# Patient Record
Sex: Male | Born: 1970 | Race: Black or African American | Hispanic: No | Marital: Single | Smoking: Never smoker
Health system: Southern US, Community
[De-identification: ages and names within clinical notes are randomized; demographics above are authoritative.]

## PROBLEM LIST (undated history)

## (undated) DIAGNOSIS — F419 Anxiety disorder, unspecified: Secondary | ICD-10-CM

## (undated) DIAGNOSIS — I10 Essential (primary) hypertension: Secondary | ICD-10-CM

## (undated) DIAGNOSIS — N200 Calculus of kidney: Secondary | ICD-10-CM

## (undated) DIAGNOSIS — M1990 Unspecified osteoarthritis, unspecified site: Secondary | ICD-10-CM

## (undated) DIAGNOSIS — Z87442 Personal history of urinary calculi: Secondary | ICD-10-CM

---

## 2012-09-28 ENCOUNTER — Encounter (HOSPITAL_COMMUNITY): Payer: Self-pay | Admitting: Emergency Medicine

## 2012-09-28 ENCOUNTER — Emergency Department (HOSPITAL_COMMUNITY)
Admission: EM | Admit: 2012-09-28 | Discharge: 2012-09-28 | Disposition: A | Discharge status: Home / Self Care | Payer: Self-pay | Attending: Emergency Medicine | Admitting: Emergency Medicine

## 2012-09-28 ENCOUNTER — Emergency Department (HOSPITAL_COMMUNITY): Payer: Self-pay

## 2012-09-28 DIAGNOSIS — N200 Calculus of kidney: Secondary | ICD-10-CM | POA: Insufficient documentation

## 2012-09-28 DIAGNOSIS — R1031 Right lower quadrant pain: Secondary | ICD-10-CM | POA: Insufficient documentation

## 2012-09-28 LAB — CBC WITH DIFFERENTIAL/PLATELET
Basophils Relative: 0 % (ref 0–1)
Eosinophils Absolute: 0.1 10*3/uL (ref 0.0–0.7)
Hemoglobin: 16.6 g/dL (ref 13.0–17.0)
MCH: 30.7 pg (ref 26.0–34.0)
MCHC: 35.8 g/dL (ref 30.0–36.0)
Monocytes Relative: 11 % (ref 3–12)
Neutro Abs: 3.7 10*3/uL (ref 1.7–7.7)
Neutrophils Relative %: 49 % (ref 43–77)
Platelets: 185 10*3/uL (ref 150–400)
RBC: 5.4 MIL/uL (ref 4.22–5.81)

## 2012-09-28 LAB — URINE MICROSCOPIC-ADD ON

## 2012-09-28 LAB — POCT I-STAT, CHEM 8
Chloride: 107 mEq/L (ref 96–112)
Glucose, Bld: 112 mg/dL — ABNORMAL HIGH (ref 70–99)
HCT: 50 % (ref 39.0–52.0)
Hemoglobin: 17 g/dL (ref 13.0–17.0)
Potassium: 3.5 mEq/L (ref 3.5–5.1)
Sodium: 145 mEq/L (ref 135–145)

## 2012-09-28 LAB — URINALYSIS, ROUTINE W REFLEX MICROSCOPIC
Glucose, UA: NEGATIVE mg/dL
Leukocytes, UA: NEGATIVE
Nitrite: NEGATIVE
Protein, ur: 30 mg/dL — AB
pH: 5.5 (ref 5.0–8.0)

## 2012-09-28 LAB — CK: Total CK: 332 U/L — ABNORMAL HIGH (ref 7–232)

## 2012-09-28 MED ORDER — NAPROXEN 500 MG PO TABS: 500.0000 mg | ORAL_TABLET | Freq: Two times a day (BID) | ORAL | Status: DC

## 2012-09-28 MED ORDER — IOHEXOL 300 MG/ML  SOLN: 100.0000 mL | Freq: Once | INTRAMUSCULAR | Status: DC | PRN

## 2012-09-28 MED ORDER — HYDROMORPHONE HCL PF 1 MG/ML IJ SOLN
1.0000 mg | Freq: Once | INTRAMUSCULAR | Status: AC
  Administered 2012-09-28: 1 mg via INTRAVENOUS
  Filled 2012-09-28: qty 1

## 2012-09-28 MED ORDER — PROMETHAZINE HCL 25 MG PO TABS: 25.0000 mg | ORAL_TABLET | Freq: Four times a day (QID) | ORAL | Status: DC | PRN

## 2012-09-28 MED ORDER — IOHEXOL 300 MG/ML  SOLN
50.0000 mL | Freq: Once | INTRAMUSCULAR | Status: AC | PRN
  Administered 2012-09-28: 50 mL via ORAL

## 2012-09-28 MED ORDER — IOHEXOL 300 MG/ML  SOLN
100.0000 mL | Freq: Once | INTRAMUSCULAR | Status: AC | PRN
  Administered 2012-09-28: 100 mL via INTRAVENOUS

## 2012-09-28 MED ORDER — HYDROCODONE-ACETAMINOPHEN 5-500 MG PO TABS: 1.0000 | ORAL_TABLET | Freq: Four times a day (QID) | ORAL | Status: DC | PRN

## 2012-09-28 MED ORDER — ONDANSETRON HCL 4 MG/2ML IJ SOLN
4.0000 mg | Freq: Once | INTRAMUSCULAR | Status: AC
  Administered 2012-09-28: 4 mg via INTRAVENOUS
  Filled 2012-09-28: qty 2

## 2012-09-28 MED ORDER — TAMSULOSIN HCL 0.4 MG PO CAPS: 0.4000 mg | ORAL_CAPSULE | Freq: Two times a day (BID) | ORAL | Status: DC

## 2012-09-28 NOTE — ED Notes (Signed)
Attempted to collect urine, Pt unable to void at this time.

## 2012-09-28 NOTE — ED Provider Notes (Signed)
History     CSN: [id number]  Arrival date & time 09/28/12  4098   First MD Initiated Contact with Patient 09/28/12 [phone]      Chief Complaint  Patient presents with  . Hematuria    (Consider location/radiation/quality/duration/timing/severity/associated sxs/prior treatment) HPI Comments: Pt with 1 week of hematuria - this has been constant, assocaited with RLQ pain and flank pain which is constant, nothing makes it better or worse.  No fevers or vomiting.  Was seen in UC last week and had cipro rx given for hematuria but has gotten worse - feels as though the urine has become more red from pink last week.  Nothing makes better or worse including cipro.  Denies injury, d/c from penis or hx of STD's, no testicular pain or swelling.  Patient is a 42 y.o. male presenting with hematuria. The history is provided by the patient and the spouse.  Hematuria    History reviewed. No pertinent past medical history.  History reviewed. No pertinent past surgical history.  No family history on file.  History  Substance Use Topics  . Smoking status: Never Smoker   . Smokeless tobacco: Not on file  . Alcohol Use: No      Review of Systems  Genitourinary: Positive for hematuria.  All other systems reviewed and are negative.    Allergies  Review of patient's allergies indicates not on file.  Home Medications  No current outpatient prescriptions on file.  BP 153/107  Pulse 92  Temp(Src) 98.7 F (37.1 C) (Oral)  Resp 22  SpO2 97%  Physical Exam  Nursing note and vitals reviewed. Constitutional: He appears well-developed and well-nourished.  Uncomfortable appearing  HENT:  Head: Normocephalic and atraumatic.  Mouth/Throat: Oropharynx is clear and moist. No oropharyngeal exudate.  Eyes: Conjunctivae and EOM are normal. Pupils are equal, round, and reactive to light. Right eye exhibits no discharge. Left eye exhibits no discharge. No scleral icterus.  Neck: Normal range of  motion. Neck supple. No JVD present. No thyromegaly present.  Cardiovascular: Normal rate, regular rhythm, normal heart sounds and intact distal pulses.  Exam reveals no gallop and no friction rub.   No murmur heard. Pulmonary/Chest: Effort normal and breath sounds normal. No respiratory distress. He has no wheezes. He has no rales.  Abdominal: Soft. Bowel sounds are normal. He exhibits no distension and no mass. There is tenderness ( Mild right lower quadrant tenderness, right CVA tenderness).  Genitourinary:  Normal-appearing circumcised penis, and normal appearing scrotum and testicles, no hernias, no discharge at the urethral meatus  Musculoskeletal: Normal range of motion. He exhibits no edema and no tenderness.  Lymphadenopathy:    He has no cervical adenopathy.  Neurological: He is alert. Coordination normal.  Skin: Skin is warm and dry. No rash noted. No erythema.  Psychiatric: He has a normal mood and affect. His behavior is normal.    ED Course  Procedures (including critical care time)  Labs Reviewed  URINALYSIS, ROUTINE W REFLEX MICROSCOPIC  CK  CBC WITH DIFFERENTIAL   No results found.   No diagnosis found.    MDM  Patient with ongoing right lower quadrant and right flank pain with hematuria which is gross, no improvement on ciprofloxacin, need CT scan to rule out other sources such as kidney stones and appendicitis. Pain medication, nausea medication, labs, CK.  Pt improved after meds, d/w the results of the study, VS with mild hypertension but can f/u.  Pt aware of all findigns from the CT  and will f/u with Urology  Discharge Medication List as of 09/28/2012 12:51 PM    START taking these medications   Details  HYDROcodone-acetaminophen (VICODIN) 5-500 MG per tablet Take 1-2 tablets by mouth every 6 (six) hours as needed for pain., Starting 09/28/2012, Until Discontinued, Print    naproxen (NAPROSYN) 500 MG tablet Take 1 tablet (500 mg total) by mouth 2 (two)  times daily with a meal., Starting 09/28/2012, Until Discontinued, Print    promethazine (PHENERGAN) 25 MG tablet Take 1 tablet (25 mg total) by mouth every 6 (six) hours as needed for nausea., Starting 09/28/2012, Until Discontinued, Print    tamsulosin (FLOMAX) 0.4 MG CAPS Take 1 capsule (0.4 mg total) by mouth 2 (two) times daily., Starting 09/28/2012, Until Discontinued, Print             Vida Roller, MD 09/28/12 [phone]

## 2012-09-28 NOTE — ED Notes (Signed)
Pt reports seen at an urgent care last week for blood in urine. 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 Pt reports dark red color urine at present. Pt c/o right flank pain and shortness of breath.

## 2012-09-29 ENCOUNTER — Encounter (HOSPITAL_COMMUNITY)
Admission: RE | Disposition: A | Discharge status: Home / Self Care | Payer: Self-pay | Source: Ambulatory Visit | Attending: Urology

## 2012-09-29 ENCOUNTER — Encounter (HOSPITAL_COMMUNITY): Payer: Self-pay | Admitting: *Deleted

## 2012-09-29 ENCOUNTER — Ambulatory Visit (HOSPITAL_COMMUNITY)
Admission: RE | Admit: 2012-09-29 | Discharge: 2012-09-29 | Disposition: A | Discharge status: Home / Self Care | Payer: Self-pay | Source: Ambulatory Visit | Attending: Urology | Admitting: Urology

## 2012-09-29 ENCOUNTER — Other Ambulatory Visit: Payer: Self-pay | Admitting: Urology

## 2012-09-29 ENCOUNTER — Encounter (HOSPITAL_COMMUNITY): Payer: Self-pay | Admitting: Anesthesiology

## 2012-09-29 ENCOUNTER — Ambulatory Visit (HOSPITAL_COMMUNITY): Payer: Self-pay | Admitting: Anesthesiology

## 2012-09-29 DIAGNOSIS — Z79899 Other long term (current) drug therapy: Secondary | ICD-10-CM | POA: Insufficient documentation

## 2012-09-29 DIAGNOSIS — N201 Calculus of ureter: Secondary | ICD-10-CM | POA: Insufficient documentation

## 2012-09-29 DIAGNOSIS — Z87891 Personal history of nicotine dependence: Secondary | ICD-10-CM | POA: Insufficient documentation

## 2012-09-29 HISTORY — PX: HOLMIUM LASER APPLICATION: SHX5852

## 2012-09-29 HISTORY — PX: CYSTOSCOPY/RETROGRADE/URETEROSCOPY/STONE EXTRACTION WITH BASKET: SHX5317

## 2012-09-29 LAB — SURGICAL PCR SCREEN
MRSA, PCR: POSITIVE — AB
Staphylococcus aureus: POSITIVE — AB

## 2012-09-29 SURGERY — CYSTOSCOPY, WITH CALCULUS REMOVAL USING BASKET
Anesthesia: General | Site: Ureter | Laterality: Right | Wound class: Clean Contaminated

## 2012-09-29 MED ORDER — PROMETHAZINE HCL 25 MG/ML IJ SOLN: 6.2500 mg | INTRAMUSCULAR | Status: DC | PRN

## 2012-09-29 MED ORDER — LACTATED RINGERS IV SOLN
INTRAVENOUS | Status: DC
Start: 2012-09-29 — End: 2012-10-01
  Administered 2012-09-29: 21:00:00 via INTRAVENOUS

## 2012-09-29 MED ORDER — CIPROFLOXACIN IN D5W 400 MG/200ML IV SOLN
400.0000 mg | INTRAVENOUS | Status: AC
  Administered 2012-09-29: 400 mg via INTRAVENOUS

## 2012-09-29 MED ORDER — OXYCODONE HCL 5 MG PO TABS: 5.0000 mg | ORAL_TABLET | ORAL | Status: DC | PRN

## 2012-09-29 MED ORDER — MIDAZOLAM HCL 5 MG/5ML IJ SOLN
INTRAMUSCULAR | Status: DC | PRN
  Administered 2012-09-29: 2 mg via INTRAVENOUS

## 2012-09-29 MED ORDER — PHENAZOPYRIDINE HCL 200 MG PO TABS: 200.0000 mg | ORAL_TABLET | Freq: Three times a day (TID) | ORAL | Status: DC | PRN

## 2012-09-29 MED ORDER — ONDANSETRON HCL 4 MG/2ML IJ SOLN
INTRAMUSCULAR | Status: AC
  Filled 2012-09-29: qty 2

## 2012-09-29 MED ORDER — ONDANSETRON HCL 4 MG/2ML IJ SOLN
4.0000 mg | Freq: Four times a day (QID) | INTRAMUSCULAR | Status: DC | PRN
  Administered 2012-09-29: 4 mg via INTRAVENOUS

## 2012-09-29 MED ORDER — KETOROLAC TROMETHAMINE 30 MG/ML IJ SOLN
INTRAMUSCULAR | Status: DC | PRN
  Administered 2012-09-29: 30 mg via INTRAVENOUS

## 2012-09-29 MED ORDER — ONDANSETRON HCL 4 MG/2ML IJ SOLN
INTRAMUSCULAR | Status: DC | PRN
  Administered 2012-09-29: 4 mg via INTRAVENOUS

## 2012-09-29 MED ORDER — ACETAMINOPHEN 650 MG RE SUPP
650.0000 mg | RECTAL | Status: DC | PRN
  Filled 2012-09-29: qty 1

## 2012-09-29 MED ORDER — DEXAMETHASONE SODIUM PHOSPHATE 10 MG/ML IJ SOLN
INTRAMUSCULAR | Status: DC | PRN
  Administered 2012-09-29: 10 mg via INTRAVENOUS

## 2012-09-29 MED ORDER — PROPOFOL 10 MG/ML IV BOLUS
INTRAVENOUS | Status: DC | PRN
  Administered 2012-09-29: 300 mg via INTRAVENOUS

## 2012-09-29 MED ORDER — IOHEXOL 300 MG/ML  SOLN
INTRAMUSCULAR | Status: DC | PRN
  Administered 2012-09-29: 8 mL via URETHRAL

## 2012-09-29 MED ORDER — FENTANYL CITRATE 0.05 MG/ML IJ SOLN: 25.0000 ug | INTRAMUSCULAR | Status: DC | PRN

## 2012-09-29 MED ORDER — SODIUM CHLORIDE 0.9 % IR SOLN
Status: DC | PRN
  Administered 2012-09-29: 1000 mL

## 2012-09-29 MED ORDER — MUPIROCIN 2 % EX OINT
TOPICAL_OINTMENT | Freq: Two times a day (BID) | CUTANEOUS | Status: DC
  Filled 2012-09-29: qty 22

## 2012-09-29 MED ORDER — LIDOCAINE HCL (CARDIAC) 20 MG/ML IV SOLN
INTRAVENOUS | Status: DC | PRN
  Administered 2012-09-29: 100 mg via INTRAVENOUS

## 2012-09-29 MED ORDER — ACETAMINOPHEN 10 MG/ML IV SOLN
INTRAVENOUS | Status: AC
  Filled 2012-09-29: qty 100

## 2012-09-29 MED ORDER — LACTATED RINGERS IV SOLN
INTRAVENOUS | Status: DC | PRN
  Administered 2012-09-29: 19:00:00 via INTRAVENOUS

## 2012-09-29 MED ORDER — SODIUM CHLORIDE 0.9 % IJ SOLN: 3.0000 mL | INTRAMUSCULAR | Status: DC | PRN

## 2012-09-29 MED ORDER — HYOSCYAMINE SULFATE 0.125 MG SL SUBL: 0.1250 mg | SUBLINGUAL_TABLET | SUBLINGUAL | Status: DC | PRN

## 2012-09-29 MED ORDER — FENTANYL CITRATE 0.05 MG/ML IJ SOLN
INTRAMUSCULAR | Status: DC | PRN
  Administered 2012-09-29: 50 ug via INTRAVENOUS
  Administered 2012-09-29: 25 ug via INTRAVENOUS

## 2012-09-29 MED ORDER — IOHEXOL 300 MG/ML  SOLN
INTRAMUSCULAR | Status: AC
  Filled 2012-09-29: qty 1

## 2012-09-29 MED ORDER — SODIUM CHLORIDE 0.9 % IJ SOLN: 3.0000 mL | Freq: Two times a day (BID) | INTRAMUSCULAR | Status: DC

## 2012-09-29 MED ORDER — ACETAMINOPHEN 10 MG/ML IV SOLN
INTRAVENOUS | Status: DC | PRN
  Administered 2012-09-29: 1000 mg via INTRAVENOUS

## 2012-09-29 MED ORDER — ACETAMINOPHEN 325 MG PO TABS: 650.0000 mg | ORAL_TABLET | ORAL | Status: DC | PRN

## 2012-09-29 MED ORDER — CIPROFLOXACIN IN D5W 400 MG/200ML IV SOLN
INTRAVENOUS | Status: AC
  Filled 2012-09-29: qty 200

## 2012-09-29 MED ORDER — SODIUM CHLORIDE 0.9 % IV SOLN: 250.0000 mL | INTRAVENOUS | Status: DC | PRN

## 2012-09-29 MED ORDER — MUPIROCIN 2 % EX OINT
TOPICAL_OINTMENT | CUTANEOUS | Status: AC
  Administered 2012-09-29: 17:00:00 via NASAL
  Filled 2012-09-29: qty 22

## 2012-09-29 MED ORDER — MEPERIDINE HCL 50 MG/ML IJ SOLN: 6.2500 mg | INTRAMUSCULAR | Status: DC | PRN

## 2012-09-29 MED ORDER — OXYCODONE-ACETAMINOPHEN 5-325 MG PO TABS: 1.0000 | ORAL_TABLET | ORAL | Status: DC | PRN

## 2012-09-29 SURGICAL SUPPLY — 17 items
BAG URO CATCHER STRL LF (DRAPE) ×2 IMPLANT
BASKET LASER NITINOL 1.9FR (BASKET) IMPLANT
BASKET ZERO TIP NITINOL 2.4FR (BASKET) ×2 IMPLANT
CATH URET 5FR 28IN OPEN ENDED (CATHETERS) ×2 IMPLANT
CLOTH BEACON ORANGE TIMEOUT ST (SAFETY) ×2 IMPLANT
DRAPE CAMERA CLOSED 9X96 (DRAPES) ×2 IMPLANT
GLOVE SURG SS PI 8.0 STRL IVOR (GLOVE) ×2 IMPLANT
GOWN PREVENTION PLUS XLARGE (GOWN DISPOSABLE) ×2 IMPLANT
GOWN STRL REIN XL XLG (GOWN DISPOSABLE) ×4 IMPLANT
GUIDEWIRE ANG ZIPWIRE 038X150 (WIRE) ×2 IMPLANT
GUIDEWIRE STR DUAL SENSOR (WIRE) ×2 IMPLANT
LASER FIBER DISP (UROLOGICAL SUPPLIES) ×2 IMPLANT
MANIFOLD NEPTUNE II (INSTRUMENTS) ×2 IMPLANT
MARKER SKIN DUAL TIP RULER LAB (MISCELLANEOUS) ×2 IMPLANT
PACK CYSTO (CUSTOM PROCEDURE TRAY) ×2 IMPLANT
SHEATH URET ACCESS 12FR/35CM (UROLOGICAL SUPPLIES) ×2 IMPLANT
TUBING CONNECTING 10 (TUBING) ×2 IMPLANT

## 2012-09-29 NOTE — Preoperative (Signed)
Beta Blockers   Reason not to administer Beta Blockers:Not Applicable 

## 2012-09-29 NOTE — Brief Op Note (Signed)
09/29/2012  8:26 PM  PATIENT:  Chris Williams  42 y.o. male  PRE-OPERATIVE DIAGNOSIS:  right ureteral stone with obstruction  POST-OPERATIVE DIAGNOSIS:  Right ureteral stone with obstruction  PROCEDURE:  Procedure(s): CYSTOSCOPY/RETROGRADE/URETEROSCOPY/STONE EXTRACTION WITH BASKET (Right) HOLMIUM LASER APPLICATION (Right) Right ureteral stent insertion.   SURGEON:  Surgeon(s) and Role:    * Anner Crete, MD - Primary  PHYSICIAN ASSISTANT:   ASSISTANTS: none   ANESTHESIA:   general  EBL:     BLOOD ADMINISTERED:none  DRAINS: 6x28 right JJ stent   LOCAL MEDICATIONS USED:  NONE  SPECIMEN:  Source of Specimen:  stone  DISPOSITION OF SPECIMEN:  to patient  COUNTS:  YES  TOURNIQUET:  * No tourniquets in log *  DICTATION: .Other Dictation: Dictation Number [id number]  PLAN OF CARE: Discharge to home after PACU  PATIENT DISPOSITION:  PACU - hemodynamically stable.   Delay start of Pharmacological VTE agent (>24hrs) due to surgical blood loss or risk of bleeding: not applicable

## 2012-09-29 NOTE — H&P (Signed)
ctive Problems Problems  1. Ureteral Stone Right 592.1  History of Present Illness  Chris Williams is a 42 yo BM sent from the ER for a 7mm right ureteral stone.  He had the onset of hematuria about 3 weeks ago.  He was seen in an urgent care and was given cipro.  He then began to have pain 2 night ago that was associated with frequency and urgency.  The pain was severe with nausea.  He has had no prior GU history.  He still has some pain but it is not as severe and he continues to have urgency.   Past Medical History Problems  1. History of  No Medical Problems  Surgical History Problems  1. History of  No Surgical Problems  Current Meds 1. Flomax 0.4 MG Oral Capsule; Therapy: (Recorded:24Mar2014) to 2. Hydrocodone-Acetaminophen 5-325 MG Oral Tablet; Therapy: (Recorded:24Mar2014) to 3. Naprosyn TABS; Therapy: (Recorded:24Mar2014) to 4. Zofran TABS; Therapy: (Recorded:24Mar2014) to  Allergies Medication  1. No Known Drug Allergies  Family History Problems  1. Paternal uncle's history of  Diabetes Mellitus V18.0 2. Paternal uncle's history of  Diabetes Mellitus V18.0 3. Maternal uncle's history of  Diabetes Mellitus V18.0 4. Maternal uncle's history of  Diabetes Mellitus V18.0 5. Family history of  Family Health Status Number Of Children 1 son and 1 daughter 88. Paternal history of  Hypertension V17.49 7. Maternal history of  Hypertension V17.49  Social History Problems    Caffeine Use 2 per day   Marital History - Single   Occupation: Truck Psychologist, occupational    History of  Alcohol Use   History of  Tobacco Use 305.1  Review of Systems Genitourinary, constitutional, skin, eye, otolaryngeal, hematologic/lymphatic, cardiovascular, pulmonary, endocrine, musculoskeletal, gastrointestinal, neurological and psychiatric system(s) were reviewed and pertinent findings if present are noted.  Genitourinary: urinary frequency, nocturia and hematuria.  Constitutional: night sweats,  feeling tired (fatigue) (with the stone) and recent weight loss.  Integumentary: pruritus.  Endocrine: polydipsia.  Musculoskeletal: back pain and joint pain.    Vitals Vital Signs [Data Includes: Last 1 Day]  24Mar2014 12:46PM  BMI Calculated: 29.26 BSA Calculated: 2.3 Height: 6 ft 2 in Weight: 228 lb  Blood Pressure: 112 / 78 Temperature: 98.4 F Heart Rate: 99  Physical Exam Constitutional: Well nourished and well developed . No acute distress.  ENT:. The ears and nose are normal in appearance.  Neck: The appearance of the neck is normal and no neck mass is present.  Pulmonary: No respiratory distress and normal respiratory rhythm and effort.  Cardiovascular: Heart rate and rhythm are normal . No peripheral edema.  Abdomen: The abdomen is flat. No masses are palpated. Moderate tenderness in the RLQ is present. moderate right CVA tenderness, but no left CVA tenderness. No hernias are palpable. No hepatosplenomegaly noted.  Skin: Normal skin turgor, no visible rash and no visible skin lesions.  Neuro/Psych:. Mood and affect are appropriate.    Results/Data Urine [Data Includes: Last 1 Day]   24Mar2014  COLOR YELLOW   APPEARANCE CLEAR   SPECIFIC GRAVITY >1.030   pH 5.5   GLUCOSE NEG mg/dL  BILIRUBIN NEG   KETONE NEG mg/dL  BLOOD LARGE   PROTEIN 100 mg/dL  UROBILINOGEN 0.2 mg/dL  NITRITE NEG   LEUKOCYTE ESTERASE NEG   SQUAMOUS EPITHELIAL/HPF RARE   WBC 0-2 WBC/hpf  RBC 11-20 RBC/hpf  BACTERIA RARE   CRYSTALS NONE SEEN   CASTS NONE SEEN   Other MUCUS    The following images/tracing/specimen  were independently visualized:  I reviewed his CT and he has a 5.17mm right mid ureteral stone with obstruction and a left renal cyst. KUB today doesn't clearly show the stone. There is one possiblity over the right sacrum. He has phleboliths. The bones are normal.    Assessment Assessed  1. Ureteral Stone Right 592.1   He has a symptomatic right mid ureteral stone.    Plan Health Maintenance (V70.0)  1. UA With REFLEX  Done: 24Mar2014 12:19PM Ureteral Stone (592.1)  2. KUB  Done: 24Mar2014 12:00AM 3. Follow-up Schedule Surgery Office  Follow-up  Requested for: 24Mar2014   I discussed the options of MET and ureteroscopy.  I don't think he is a good ESWL candidate.  He is interested in ureteroscopy and I reviewed the risks of bleeding, infection, ureteral injury, need for a stent, need for secondary procedures, thrombotic events and anesthetic complications.

## 2012-09-29 NOTE — Transfer of Care (Signed)
Immediate Anesthesia Transfer of Care Note  Patient: Chris Williams  Procedure(s) Performed: Procedure(s): CYSTOSCOPY/RETROGRADE/URETEROSCOPY/STONE EXTRACTION WITH BASKET (Right) HOLMIUM LASER APPLICATION (Right)  Patient Location: PACU  Anesthesia Type:General  Level of Consciousness: awake and oriented  Airway & Oxygen Therapy: Patient Spontanous Breathing and Patient connected to face mask oxygen  Post-op Assessment: Report given to PACU RN and Post -op Vital signs reviewed and stable  Post vital signs: Reviewed and stable  Complications: No apparent anesthesia complications

## 2012-09-29 NOTE — Anesthesia Preprocedure Evaluation (Signed)

## 2012-09-30 ENCOUNTER — Encounter (HOSPITAL_COMMUNITY): Payer: Self-pay | Admitting: Urology

## 2012-09-30 NOTE — Op Note (Signed)
Chris Williams, AUKER NO.:  [id number]  MEDICAL RECORD NO.:  [id number]  LOCATION:  [facility]                         FACILITY:  [facility]  PHYSICIAN:  Excell Seltzer. Chris Williams, M.D.    DATE OF BIRTH:  Jul 15, 1970  DATE OF PROCEDURE:  09/29/2012 DATE OF DISCHARGE:                              OPERATIVE REPORT   PREOPERATIVE DIAGNOSIS:  A 5 x 7 mm right distal stone with obstruction.  POSTOPERATIVE DIAGNOSE:  A 5 x 7 mm right distal stone with obstruction.  PROCEDURE:  Cystoscopy right retrograde pyelogram with interpretation, right ureteroscopic stone extraction with holmium lithotripsy, insertion of right double-J stent.  SURGEON:  Excell Seltzer. Chris Williams, M.D.  ANESTHESIA:  General.  SPECIMEN:  Stone fragments.  DRAINS:  A 6-French 28 cm double-J stent on the right.  COMPLICATIONS:  Right ureteral false passage.  INDICATIONS:  Chris Williams is a 42 year old white male, who had the onset 3 weeks ago, gross hematuria.  Over the weekend he had severe right flank pain.  He had CT in the ER that showed a 5.7 mm right midureteral stone. He was seen in the office today.  On KUB, it was felt that stone was possibly still in the midureter, although it was not clear.  I reviewed the options with the patient, if continued medical expulsive therapy versus ureteroscopic stone extraction because of his ongoing pain, he desired to have intervention.  FINDINGS OF PROCEDURE:  He was taken to the operating room, where he was fitted with PFOs.  A general anesthetic was induced.  He was placed in lithotomy position.  His perineum and genitalia were prepped with Betadine solution.  He was draped in usual sterile fashion.  Cystoscopy was performed using a 22-French scope and 12-degree lens. Examination revealed a normal urethra.  The external sphincter was intact.  The prostatic urethra was short without obstruction. Examination of bladder revealed normal mucosa without tumor stones or inflammation.  There  was a small old clot in the bladder that was evacuated.  The ureteral orifices were unremarkable.  The right ureteral orifice was cannulated with 5-French opening catheter and contrast was instilled.  Initially, the contrast appeared extravasated around the ureter, but I backed the catheter out slightly and re-angled it and injected contrast and the distal ureter was seen, it was narrow with a filling defect approximately 5 cm proximal to the orifice.  At this point, an attempt was made to pass a guidewire, but because of the false passage that was not initially successful.  The cystoscope was then replaced with a 6.4-French short ureteroscope, which was placed to the ureteral orifice.  With the scope in place, I was able to visualize the false passage posteriorly and ureteral lumen anteromedially.  The ureteral orifice was cannulated with a Sensor wire, which was passed to the kidney without difficulty.  The ureteroscope was removed.  A 12-French introducer sheath inner core was used to dilate the distal ureter.  The ureteroscope was then reinserted over the wire, and the stone was visualized.  Initially, an attempt was made to remove the stone intact with a Nitinol basket, but the stone was too large for the ureteral  lumen and was disengaged from the basket.  A 365-micron holmium laser fiber was then used with prior set on 0.5 watts and the frequency at 20 hertz to fragment the stone into manageable pieces.  The basket was then used to remove 3 large fragments and also flushed out.  Once the fragments were removed, the cystoscope was reinserted and the fragments were evacuated from the bladder.  The cystoscope was reinserted over the wire and a 6-French and 28 cm double-J stent with string was passed to the kidney without difficulty. The wire was removed leaving good coil in the bladder and good coil in the kidney.  The bladder was then drained.  The cystoscope was  removed leaving the stent string exiting the urethra.  The string was secured to the patient's penis.  He was taken down from lithotomy position.  His anesthetic was reversed.  He was moved to recovery room in stable condition.  There were no complications.  The only complication was the posterior ureteral false passage, which was easily managed.     Excell Seltzer. Chris Williams, M.D.     JJW/MEDQ  D:  09/29/2012  T:  09/30/2012  Job:  [id number]

## 2012-10-02 NOTE — Anesthesia Postprocedure Evaluation (Signed)
  Anesthesia Post-op Note  Patient: Chris Williams  Procedure(s) Performed: Procedure(s) (LRB): CYSTOSCOPY/RETROGRADE/URETEROSCOPY/STONE EXTRACTION WITH BASKET (Right) HOLMIUM LASER APPLICATION (Right)  Patient Location: PACU  Anesthesia Type: General  Level of Consciousness: awake and alert   Airway and Oxygen Therapy: Patient Spontanous Breathing  Post-op Pain: mild  Post-op Assessment: Post-op Vital signs reviewed, Patient's Cardiovascular Status Stable, Respiratory Function Stable, Patent Airway and No signs of Nausea or vomiting  Last Vitals:  Filed Vitals:   09/29/12 2145  BP: 144/92  Pulse:   Temp: 36.6 C  Resp:     Post-op Vital Signs: stable   Complications: No apparent anesthesia complications

## 2016-05-16 ENCOUNTER — Emergency Department (HOSPITAL_COMMUNITY): Payer: BLUE CROSS/BLUE SHIELD

## 2016-05-16 ENCOUNTER — Encounter (HOSPITAL_COMMUNITY): Payer: Self-pay

## 2016-05-16 ENCOUNTER — Inpatient Hospital Stay (HOSPITAL_COMMUNITY)
Admission: EM | Admit: 2016-05-16 | Discharge: 2016-05-19 | DRG: 558 | Disposition: A | Discharge status: Home / Self Care | Payer: BLUE CROSS/BLUE SHIELD | Attending: Internal Medicine | Admitting: Internal Medicine

## 2016-05-16 DIAGNOSIS — R0789 Other chest pain: Secondary | ICD-10-CM

## 2016-05-16 DIAGNOSIS — E860 Dehydration: Secondary | ICD-10-CM | POA: Diagnosis present

## 2016-05-16 DIAGNOSIS — R079 Chest pain, unspecified: Secondary | ICD-10-CM | POA: Diagnosis present

## 2016-05-16 DIAGNOSIS — E876 Hypokalemia: Secondary | ICD-10-CM | POA: Diagnosis present

## 2016-05-16 DIAGNOSIS — M6282 Rhabdomyolysis: Secondary | ICD-10-CM | POA: Diagnosis present

## 2016-05-16 DIAGNOSIS — Z8249 Family history of ischemic heart disease and other diseases of the circulatory system: Secondary | ICD-10-CM

## 2016-05-16 DIAGNOSIS — N179 Acute kidney failure, unspecified: Secondary | ICD-10-CM | POA: Diagnosis present

## 2016-05-16 DIAGNOSIS — Z87442 Personal history of urinary calculi: Secondary | ICD-10-CM

## 2016-05-16 DIAGNOSIS — M791 Myalgia, unspecified site: Secondary | ICD-10-CM

## 2016-05-16 HISTORY — DX: Calculus of kidney: N20.0

## 2016-05-16 LAB — CBC WITH DIFFERENTIAL/PLATELET
BASOS ABS: 0 10*3/uL (ref 0.0–0.1)
Basophils Relative: 0 %
Eosinophils Absolute: 0 10*3/uL (ref 0.0–0.7)
Eosinophils Relative: 0 %
HEMATOCRIT: 47.5 % (ref 39.0–52.0)
HEMOGLOBIN: 16.8 g/dL (ref 13.0–17.0)
LYMPHS PCT: 15 %
Lymphs Abs: 1.6 10*3/uL (ref 0.7–4.0)
MCH: 30.5 pg (ref 26.0–34.0)
MCHC: 35.4 g/dL (ref 30.0–36.0)
MCV: 86.4 fL (ref 78.0–100.0)
MONO ABS: 1.4 10*3/uL — AB (ref 0.1–1.0)
MONOS PCT: 13 %
NEUTROS ABS: 8 10*3/uL — AB (ref 1.7–7.7)
Neutrophils Relative %: 72 %
Platelets: 196 10*3/uL (ref 150–400)
RBC: 5.5 MIL/uL (ref 4.22–5.81)
RDW: 13 % (ref 11.5–15.5)
WBC: 11 10*3/uL — ABNORMAL HIGH (ref 4.0–10.5)

## 2016-05-16 LAB — BASIC METABOLIC PANEL
ANION GAP: 10 (ref 5–15)
BUN: 19 mg/dL (ref 6–20)
CALCIUM: 10.6 mg/dL — AB (ref 8.9–10.3)
CHLORIDE: 106 mmol/L (ref 101–111)
CO2: 26 mmol/L (ref 22–32)
Creatinine, Ser: 2.18 mg/dL — ABNORMAL HIGH (ref 0.61–1.24)
GFR calc Af Amer: 41 mL/min — ABNORMAL LOW (ref >60)
GFR calc non Af Amer: 35 mL/min — ABNORMAL LOW (ref >60)
GLUCOSE: 76 mg/dL (ref 65–99)
Potassium: 3.2 mmol/L — ABNORMAL LOW (ref 3.5–5.1)
Sodium: 142 mmol/L (ref 135–145)

## 2016-05-16 LAB — I-STAT TROPONIN, ED
TROPONIN I, POC: 0 ng/mL (ref 0.00–0.08)
Troponin i, poc: 0 ng/mL (ref 0.00–0.08)

## 2016-05-16 LAB — D-DIMER, QUANTITATIVE (NOT AT ARMC)

## 2016-05-16 LAB — CK: Total CK: 660 U/L — ABNORMAL HIGH (ref 49–397)

## 2016-05-16 MED ORDER — IOPAMIDOL (ISOVUE-370) INJECTION 76%
100.0000 mL | Freq: Once | INTRAVENOUS | Status: AC | PRN
  Administered 2016-05-16: 80 mL via INTRAVENOUS

## 2016-05-16 MED ORDER — FENTANYL CITRATE (PF) 100 MCG/2ML IJ SOLN
25.0000 ug | Freq: Once | INTRAMUSCULAR | Status: AC
  Administered 2016-05-16: 25 ug via INTRAVENOUS
  Filled 2016-05-16: qty 2

## 2016-05-16 MED ORDER — KETOROLAC TROMETHAMINE 15 MG/ML IJ SOLN
15.0000 mg | Freq: Once | INTRAMUSCULAR | Status: AC
  Administered 2016-05-16: 15 mg via INTRAVENOUS
  Filled 2016-05-16: qty 1

## 2016-05-16 MED ORDER — SODIUM CHLORIDE 0.9 % IV BOLUS (SEPSIS)
1000.0000 mL | Freq: Once | INTRAVENOUS | Status: AC
  Administered 2016-05-16: 1000 mL via INTRAVENOUS

## 2016-05-16 MED ORDER — MORPHINE SULFATE (PF) 4 MG/ML IV SOLN: 4.0000 mg | Freq: Once | INTRAVENOUS | Status: DC

## 2016-05-16 MED ORDER — POTASSIUM CHLORIDE CRYS ER 20 MEQ PO TBCR
60.0000 meq | EXTENDED_RELEASE_TABLET | Freq: Once | ORAL | Status: AC
  Administered 2016-05-16: 60 meq via ORAL
  Filled 2016-05-16: qty 3

## 2016-05-16 MED ORDER — SODIUM CHLORIDE 0.9 % IV BOLUS (SEPSIS)
1000.0000 mL | Freq: Once | INTRAVENOUS | Status: AC
Start: 2016-05-16 — End: 2016-05-16
  Administered 2016-05-16: 1000 mL via INTRAVENOUS

## 2016-05-16 NOTE — ED Notes (Signed)
No respiratory or acute distress noted alert and oriented x 3 no reaction to medication noted call light in reach visitor at bedside. 

## 2016-05-16 NOTE — ED Triage Notes (Signed)
Extremity cramping and pain for about 30 minutes Pta to  ER no other complaints.

## 2016-05-16 NOTE — ED Provider Notes (Signed)
[facility]-EMERGENCY DEPT Provider Note   CSN: [id number] Arrival date & time: 05/16/16  1922  By signing my name below, I, Octavia Heir, attest that this documentation has been prepared under the direction and in the presence of [employer], PA-C.  Electronically Signed: Octavia Heir, ED Scribe. 05/16/16. 8:35 PM.    History   Chief Complaint Chief Complaint  Patient presents with  . Extremity Pain    The history is provided by the patient. No language interpreter was used.   HPI Comments: Chris Williams is a 45 y.o. male who was brought in by EMS presents to the Emergency Department complaining of sudden onset, unchanged, "cramping" sensations in his bilateral lower extremities and electric shocking-like sensations in his bilateral upper extremities ("like you put your finger in an electrical socket") that started about 2 hours ago. Shocking sensations originate distally and radiates from bilateral shoulders toward the patient's central chest, causing c/o chest tightness. Pt further notes having diaphoresis, shortness of breath, and light-headedness. Pt says his symptoms started when he got out of the bathtub this evening at 1830. He has taken tylenol, 325 mg of ASA, and a 500 bolus by EMS. Patient had mild relief of chest tightness with ASA prior to arrival. 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 Pt says he has felt short of breath upon exertion and at rest while at work in the past, but states his symptoms tonight feel similar to prior episodes. He has a maternal family hx of "cardiac problems" and a maternal uncle that has a hx of DVT in his legs. Denies nausea, vomiting, fever, or abdominal pain.   Past Medical History:  Diagnosis Date  . Kidney stone     Patient Active Problem List   Diagnosis Date Noted  . Hypokalemia 05/17/2016  . Acute renal failure (ARF) (HCC) 05/17/2016  . Dehydration 05/17/2016  . Rhabdomyolysis 05/17/2016  . Chest pain 05/17/2016  . Hypercalcemia  05/17/2016    Past Surgical History:  Procedure Laterality Date  . CYSTOSCOPY/RETROGRADE/URETEROSCOPY/STONE EXTRACTION WITH BASKET Right 09/29/2012   Procedure: CYSTOSCOPY/RETROGRADE/URETEROSCOPY/STONE EXTRACTION WITH BASKET;  Surgeon: Anner Crete, MD;  Location: [facility];  Service: Urology;  Laterality: Right;  . HOLMIUM LASER APPLICATION Right 09/29/2012   Procedure: HOLMIUM LASER APPLICATION;  Surgeon: Anner Crete, MD;  Location: [facility];  Service: Urology;  Laterality: Right;      Home Medications    Prior to Admission medications   Not on File    Family History Family History  Problem Relation Age of Onset  . Hypertension Father   . CAD Other   . Deep vein thrombosis Other   . Coronary artery disease Other     Social History Social History  Substance Use Topics  . Smoking status: Never Smoker  . Smokeless tobacco: Never Used  . Alcohol use No     Allergies   Patient has no known allergies.   Review of Systems Review of Systems  Constitutional: Positive for diaphoresis. Negative for fever.  Respiratory: Positive for shortness of breath.   Cardiovascular: Positive for chest pain (chest tightness).  Gastrointestinal: Negative for abdominal pain, nausea and vomiting.  Musculoskeletal: Positive for myalgias.  Neurological: Positive for light-headedness.  A complete 10 system review of systems was obtained and all systems are negative except as noted in the HPI and PMH.    Physical Exam Updated Vital Signs BP 124/72 (BP Location: Right Arm)   Pulse 67   Temp 97.9 F (36.6 C) (  Oral)   Resp 18   Ht 6\' 1"  (1.854 m)   Wt 130.6 kg   SpO2 99%   BMI 38.00 kg/m   Physical Exam  Constitutional: He is oriented to person, place, and time. He appears well-developed and well-nourished. No distress.  Nontoxic appearing. Patient with waves of discomfort causing wincing; c/o cramping in BLE when uncomfortable.  HENT:  Head: Normocephalic and atraumatic.  Eyes:  Conjunctivae and EOM are normal. No scleral icterus.  Neck: Normal range of motion.  No JVD  Cardiovascular: Normal rate, regular rhythm and intact distal pulses.   Pulmonary/Chest: Effort normal. No respiratory distress. He has no wheezes. He has no rales.  Lungs grossly clear bilaterally. Chest expansion symmetric.  Abdominal: Soft. He exhibits no mass. There is no tenderness. There is no guarding.  Soft, nontender abdomen  Musculoskeletal: Normal range of motion.  Neurological: He is alert and oriented to person, place, and time. He exhibits normal muscle tone. Coordination normal.  GCS 15. Patient moving all extremities.  Skin: Skin is warm and dry. No rash noted. He is not diaphoretic. No erythema. No pallor.  Psychiatric: He has a normal mood and affect. His behavior is normal.  Nursing note and vitals reviewed.    ED Treatments / Results  DIAGNOSTIC STUDIES: Oxygen Saturation is 98% on RA, normal by my interpretation.  COORDINATION OF CARE:  8:23 PM Discussed treatment plan with pt at bedside and pt agreed to plan.  Labs (all labs ordered are listed, but only abnormal results are displayed) Labs Reviewed  CBC WITH DIFFERENTIAL/PLATELET - Abnormal; Notable for the following:       Result Value   WBC 11.0 (*)    Neutro Abs 8.0 (*)    Monocytes Absolute 1.4 (*)    All other components within normal limits  BASIC METABOLIC PANEL - Abnormal; Notable for the following:    Potassium 3.2 (*)    Creatinine, Ser 2.18 (*)    Calcium 10.6 (*)    GFR calc non Af Amer 35 (*)    GFR calc Af Amer 41 (*)    All other components within normal limits  CK - Abnormal; Notable for the following:    Total CK 660 (*)    All other components within normal limits  HEPATIC FUNCTION PANEL - Abnormal; Notable for the following:    Total Protein 8.3 (*)    Bilirubin, Direct <0.1 (*)    All other components within normal limits  D-DIMER, QUANTITATIVE (NOT AT [facility])  MAGNESIUM  PHOSPHORUS    URINALYSIS, ROUTINE W REFLEX MICROSCOPIC (NOT AT [facility])  CALCIUM, IONIZED  I-STAT TROPOININ, ED  Rosezena Sensor, ED    EKG  EKG Interpretation  Date/Time:  Wednesday May 16 2016 20:43:28 EST Ventricular Rate:  77 PR Interval:    QRS Duration: 84 QT Interval:  389 QTC Calculation: 441 R Axis:   57 Text Interpretation:  Sinus rhythm No old tracing to compare Confirmed by Ethelda Chick  MD, SAM [phone]) on 05/16/2016 11:22:17 PM       Radiology Dg Chest 2 View  Result Date: 05/16/2016 CLINICAL DATA:  Chest pain and shortness of breath for 2 weeks EXAM: CHEST  2 VIEW COMPARISON:  None. FINDINGS: Lungs are clear. Heart size and pulmonary vascularity are normal. No adenopathy. No pneumothorax. No bone lesions. IMPRESSION: No edema or consolidation. Electronically Signed   By: Bretta Bang III M.D.   On: 05/16/2016 20:58   Ct Angio Chest/abd/pel For Dissection W  And/or Wo Contrast  Result Date: 05/16/2016 CLINICAL DATA:  Sudden onset cramping sensation in the upper extremities. Chest pain. EXAM: CT ANGIOGRAPHY CHEST, ABDOMEN AND PELVIS TECHNIQUE: Multidetector CT imaging through the chest, abdomen and pelvis was performed using the standard protocol during bolus administration of intravenous contrast. Multiplanar reconstructed images and MIPs were obtained and reviewed to evaluate the vascular anatomy. CONTRAST:  80 cc Isovue 370 IV COMPARISON:  None. FINDINGS: CTA CHEST FINDINGS Cardiovascular: Satisfactory opacification of the pulmonary arteries to the segmental level. No evidence of pulmonary embolism. Normal heart size. No pericardial effusion. No aortic dissection nor aneurysm. Cardiac related motion artifacts are noted the ascending aorta. Mediastinum/Nodes: There is some retained oral contrast seen in the esophagus. Delayed esophageal emptying versus reflux. No mediastinal adenopathy. Soft tissue densities consistent with residual thymic tissue noted in the anterior superior  mediastinum. Lungs/Pleura: There is bibasilar dependent atelectasis. Musculoskeletal: No chest wall abnormality. No acute or significant osseous findings. Review of the MIP images confirms the above findings. CTA ABDOMEN AND PELVIS FINDINGS VASCULAR Aorta: Normal caliber aorta without aneurysm, dissection, vasculitis or significant stenosis. Celiac: Patent without evidence of aneurysm, dissection, vasculitis or significant stenosis. SMA: Patent without evidence of aneurysm, dissection, vasculitis or significant stenosis. Renals: Both renal arteries are patent without evidence of aneurysm, dissection, vasculitis, fibromuscular dysplasia or significant stenosis. IMA: Patent without evidence of aneurysm, dissection, vasculitis or significant stenosis. Inflow: Patent without evidence of aneurysm, dissection, vasculitis or significant stenosis. Veins: No obvious venous abnormality within the limitations of this arterial phase study. Review of the MIP images confirms the above findings. NON-VASCULAR Hepatobiliary: No focal liver abnormality is seen. No gallstones, gallbladder wall thickening, or biliary dilatation. Pancreas: Unremarkable. No pancreatic ductal dilatation or surrounding inflammatory changes. Spleen: Normal in size without focal abnormality. Adrenals/Urinary Tract: Adrenal glands are unremarkable. There is a parapelvic cyst of the left kidney. This measures approximately 3.3 x 1.9 x 1.7 cm the bladder is unremarkable. No obstructive uropathy. Stomach/Bowel: A normal bowel rotation. No acute inflammation or bowel obstruction is seen. The appendix is normal. Lymphatic: No lymphadenopathy. Reproductive: Prostate is unremarkable. Other: No abdominal wall hernia or abnormality. No abdominopelvic ascites. Musculoskeletal: No acute or significant osseous findings. There is pseudoarticulation of the left L5 transverse process with S1. Review of the MIP images confirms the above findings. IMPRESSION: No aortic  dissection nor large central pulmonary embolus. Electronically Signed   By: Tollie Ethavid  Kwon M.D.   On: 05/16/2016 23:51    Procedures Procedures (including critical care time)  Medications Ordered in ED Medications  sodium chloride 0.9 % bolus 1,000 mL (0 mLs Intravenous Stopped 05/16/16 2205)  fentaNYL (SUBLIMAZE) injection 25 mcg (25 mcg Intravenous Given 05/16/16 2034)  potassium chloride SA (K-DUR,KLOR-CON) CR tablet 60 mEq (60 mEq Oral Given 05/16/16 2204)  sodium chloride 0.9 % bolus 1,000 mL (0 mLs Intravenous Stopped 05/16/16 2332)  ketorolac (TORADOL) 15 MG/ML injection 15 mg (15 mg Intravenous Given 05/16/16 2223)  iopamidol (ISOVUE-370) 76 % injection 100 mL (80 mLs Intravenous Contrast Given 05/16/16 2318)     Initial Impression / Assessment and Plan / ED Course  I have reviewed the triage vital signs and the nursing notes.  Pertinent labs & imaging results that were available during my care of the patient were reviewed by me and considered in my medical decision making (see chart for details).  Clinical Course     45 year old male presents to the emergency department for atypical symptoms of chest tightness with shock-like sensations in  his bilateral upper extremities and bilateral lower extremity cramping. Cardiac workup is reassuring and EKG is nonischemic. No evidence of aortic dissection on CT scan. Patient does have mild hypokalemia for which she was given oral potassium. He also has evidence of acute kidney injury. He has been hydrated in the emergency department with 2 L of IV fluid. Plan for admission for continued hydration. Question whether symptoms may be secondary to a viral illness as patient does have a leukocytosis with a left shift. No other clear etiology of AKI and myalgias. Case [facility] who will admit for observation.  I personally performed the services described in this documentation, which was scribed in my presence. The recorded  information has been reviewed and is accurate.    Final Clinical Impressions(s) / ED Diagnoses   Final diagnoses:  Atypical chest pain  Myalgia  AKI (acute kidney injury) [facility](HCC)    New Prescriptions New Prescriptions   No medications on file     Antony MaduraKelly Davyon Fisch, PA-C 05/17/16 0051    Doug SouSam Jacubowitz, MD 05/17/16 1600

## 2016-05-16 NOTE — ED Provider Notes (Signed)
Complains of bilateral lower extremity pain from feet to thighs onset tonight with "electric shocks ' coming from both arms and pain radiates to his anterior chest. Nothing makes symptoms better or worse. No back pain no abdominal pain. He does admit to mild shortness of breath. No other associated symptoms. Patient reports he's been having chest pain intermittently for the past 2 months which is described as tightness and worse with exertion, improved with rest. No treatment prior to coming here. On exam no distress HEENT exam no facial asymmetry neck supple trachea midline no bruit lungs clear breath sounds heart regular rate and rhythm no murmurs abdomen nondistended nontender all 4 extremities without redness swelling or tenderness neurovascularly intact. Radial pulses 2+ bilaterally DP pulses 2+ bilaterally   Doug SouSam Caeleb Batalla, MD 05/17/16 0031

## 2016-05-17 ENCOUNTER — Encounter (HOSPITAL_COMMUNITY): Payer: Self-pay | Admitting: Internal Medicine

## 2016-05-17 ENCOUNTER — Observation Stay (HOSPITAL_BASED_OUTPATIENT_CLINIC_OR_DEPARTMENT_OTHER): Payer: BLUE CROSS/BLUE SHIELD

## 2016-05-17 DIAGNOSIS — N179 Acute kidney failure, unspecified: Secondary | ICD-10-CM

## 2016-05-17 DIAGNOSIS — M791 Myalgia: Secondary | ICD-10-CM | POA: Diagnosis present

## 2016-05-17 DIAGNOSIS — M6282 Rhabdomyolysis: Secondary | ICD-10-CM | POA: Diagnosis not present

## 2016-05-17 DIAGNOSIS — R0789 Other chest pain: Secondary | ICD-10-CM

## 2016-05-17 DIAGNOSIS — E876 Hypokalemia: Secondary | ICD-10-CM | POA: Diagnosis not present

## 2016-05-17 DIAGNOSIS — E860 Dehydration: Secondary | ICD-10-CM | POA: Diagnosis present

## 2016-05-17 DIAGNOSIS — R079 Chest pain, unspecified: Secondary | ICD-10-CM | POA: Diagnosis present

## 2016-05-17 DIAGNOSIS — Z87442 Personal history of urinary calculi: Secondary | ICD-10-CM | POA: Diagnosis not present

## 2016-05-17 DIAGNOSIS — Z8249 Family history of ischemic heart disease and other diseases of the circulatory system: Secondary | ICD-10-CM | POA: Diagnosis not present

## 2016-05-17 LAB — TSH: TSH: 0.73 u[IU]/mL (ref 0.350–4.500)

## 2016-05-17 LAB — COMPREHENSIVE METABOLIC PANEL
ALBUMIN: 3.8 g/dL (ref 3.5–5.0)
ALK PHOS: 52 U/L (ref 38–126)
ALT: 21 U/L (ref 17–63)
ANION GAP: 2 — AB (ref 5–15)
AST: 39 U/L (ref 15–41)
BUN: 18 mg/dL (ref 6–20)
CALCIUM: 8.8 mg/dL — AB (ref 8.9–10.3)
CHLORIDE: 115 mmol/L — AB (ref 101–111)
CO2: 23 mmol/L (ref 22–32)
Creatinine, Ser: 1.62 mg/dL — ABNORMAL HIGH (ref 0.61–1.24)
GFR calc Af Amer: 58 mL/min — ABNORMAL LOW (ref >60)
GFR calc non Af Amer: 50 mL/min — ABNORMAL LOW (ref >60)
GLUCOSE: 106 mg/dL — AB (ref 65–99)
Potassium: 4.3 mmol/L (ref 3.5–5.1)
SODIUM: 140 mmol/L (ref 135–145)
Total Bilirubin: 0.8 mg/dL (ref 0.3–1.2)
Total Protein: 6.8 g/dL (ref 6.5–8.1)

## 2016-05-17 LAB — CBC
HEMATOCRIT: 44.3 % (ref 39.0–52.0)
HEMOGLOBIN: 15 g/dL (ref 13.0–17.0)
MCH: 30.2 pg (ref 26.0–34.0)
MCHC: 33.9 g/dL (ref 30.0–36.0)
MCV: 89.1 fL (ref 78.0–100.0)
Platelets: 188 10*3/uL (ref 150–400)
RBC: 4.97 MIL/uL (ref 4.22–5.81)
RDW: 13.3 % (ref 11.5–15.5)
WBC: 8.6 10*3/uL (ref 4.0–10.5)

## 2016-05-17 LAB — HEPATIC FUNCTION PANEL
ALBUMIN: 4.9 g/dL (ref 3.5–5.0)
ALK PHOS: 60 U/L (ref 38–126)
ALT: 22 U/L (ref 17–63)
AST: 27 U/L (ref 15–41)
BILIRUBIN TOTAL: 0.9 mg/dL (ref 0.3–1.2)
Total Protein: 8.3 g/dL — ABNORMAL HIGH (ref 6.5–8.1)

## 2016-05-17 LAB — URINE MICROSCOPIC-ADD ON
RBC / HPF: NONE SEEN RBC/hpf (ref 0–5)
SQUAMOUS EPITHELIAL / LPF: NONE SEEN

## 2016-05-17 LAB — HIV ANTIBODY (ROUTINE TESTING W REFLEX): HIV SCREEN 4TH GENERATION: NONREACTIVE

## 2016-05-17 LAB — ECHOCARDIOGRAM COMPLETE
Height: 73 in
Weight: 4608 oz

## 2016-05-17 LAB — TROPONIN I
Troponin I: <0.03 ng/mL (ref <0.03)
Troponin I: <0.03 ng/mL (ref <0.03)

## 2016-05-17 LAB — SODIUM, URINE, RANDOM: SODIUM UR: 47 mmol/L

## 2016-05-17 LAB — PHOSPHORUS
Phosphorus: 3.7 mg/dL (ref 2.5–4.6)
Phosphorus: 3.1 mg/dL (ref 2.5–4.6)

## 2016-05-17 LAB — RAPID URINE DRUG SCREEN, HOSP PERFORMED
Amphetamines: NOT DETECTED
Barbiturates: NOT DETECTED
Benzodiazepines: NOT DETECTED
Cocaine: NOT DETECTED
OPIATES: NOT DETECTED
Tetrahydrocannabinol: NOT DETECTED

## 2016-05-17 LAB — URINALYSIS, ROUTINE W REFLEX MICROSCOPIC
Bilirubin Urine: NEGATIVE
GLUCOSE, UA: NEGATIVE mg/dL
Hgb urine dipstick: NEGATIVE
KETONES UR: NEGATIVE mg/dL
LEUKOCYTES UA: NEGATIVE
Nitrite: NEGATIVE
PH: 5.5 (ref 5.0–8.0)
Protein, ur: 30 mg/dL — AB
Specific Gravity, Urine: >1.046 — ABNORMAL HIGH (ref 1.005–1.030)

## 2016-05-17 LAB — MAGNESIUM
MAGNESIUM: 2.4 mg/dL (ref 1.7–2.4)
MAGNESIUM: 2.1 mg/dL (ref 1.7–2.4)

## 2016-05-17 LAB — CREATININE, URINE, RANDOM: CREATININE, URINE: 437.79 mg/dL

## 2016-05-17 LAB — CK: CK TOTAL: 1877 U/L — AB (ref 49–397)

## 2016-05-17 LAB — MRSA PCR SCREENING: MRSA by PCR: NEGATIVE

## 2016-05-17 MED ORDER — SODIUM CHLORIDE 0.9 % IV BOLUS (SEPSIS)
1000.0000 mL | Freq: Once | INTRAVENOUS | Status: AC
  Administered 2016-05-17: 1000 mL via INTRAVENOUS

## 2016-05-17 MED ORDER — SODIUM CHLORIDE 0.9 % IV SOLN
30.0000 meq | Freq: Once | INTRAVENOUS | Status: AC
  Administered 2016-05-17: 30 meq via INTRAVENOUS
  Filled 2016-05-17: qty 15

## 2016-05-17 MED ORDER — HYDROCODONE-ACETAMINOPHEN 5-325 MG PO TABS
1.0000 | ORAL_TABLET | ORAL | Status: DC | PRN
  Administered 2016-05-19: 2 via ORAL
  Filled 2016-05-17: qty 2

## 2016-05-17 MED ORDER — ENOXAPARIN SODIUM 40 MG/0.4ML ~~LOC~~ SOLN
40.0000 mg | Freq: Every day | SUBCUTANEOUS | Status: DC
  Administered 2016-05-17 – 2016-05-19 (×3): 40 mg via SUBCUTANEOUS
  Filled 2016-05-17 (×3): qty 0.4

## 2016-05-17 MED ORDER — ACETAMINOPHEN 325 MG PO TABS: 650.0000 mg | ORAL_TABLET | Freq: Four times a day (QID) | ORAL | Status: DC | PRN

## 2016-05-17 MED ORDER — ACETAMINOPHEN 650 MG RE SUPP: 650.0000 mg | Freq: Four times a day (QID) | RECTAL | Status: DC | PRN

## 2016-05-17 MED ORDER — SODIUM CHLORIDE 0.9 % IV SOLN
INTRAVENOUS | Status: DC
  Administered 2016-05-17 – 2016-05-19 (×5): via INTRAVENOUS

## 2016-05-17 MED ORDER — ONDANSETRON HCL 4 MG/2ML IJ SOLN: 4.0000 mg | Freq: Four times a day (QID) | INTRAMUSCULAR | Status: DC | PRN

## 2016-05-17 MED ORDER — METHOCARBAMOL 500 MG PO TABS
750.0000 mg | ORAL_TABLET | Freq: Four times a day (QID) | ORAL | Status: DC | PRN
  Administered 2016-05-17: 750 mg via ORAL
  Filled 2016-05-17: qty 2

## 2016-05-17 MED ORDER — SODIUM CHLORIDE 0.9% FLUSH
3.0000 mL | Freq: Two times a day (BID) | INTRAVENOUS | Status: DC
  Administered 2016-05-17 – 2016-05-19 (×2): 3 mL via INTRAVENOUS

## 2016-05-17 MED ORDER — ONDANSETRON HCL 4 MG PO TABS: 4.0000 mg | ORAL_TABLET | Freq: Four times a day (QID) | ORAL | Status: DC | PRN

## 2016-05-17 NOTE — Progress Notes (Signed)
PROGRESS NOTE                                                                                                                                                                                                             Patient Demographics:    Chris Williams, is a 45 y.o. male, DOB - 1971/05/16, UJW:[id number]  Admit date - 05/16/2016   Admitting Physician Therisa DoyneAnastassia Doutova, MD  Outpatient Primary MD for the patient is Pcp Not In System  LOS - 0    Chief Complaint  Patient presents with  . Extremity Pain       Brief Narrative   Patient admitted earlier during the day by my colleague, chart, labs and imaging were reviewed, workup significant for rhabdomyolysis   Subjective:    Chris LefortCorey Naugle today has, No headache, No chest pain, No abdominal pain - But reports generalized muscle ache  Assessment  & Plan :    Active Problems:   Hypokalemia   Acute renal failure (ARF) (HCC)   Dehydration   Rhabdomyolysis   Chest pain   Hypercalcemia   Rhabdomyolysis and dehydration - Reports generalized body pain, significantly elevated CK at 1800, possibly related to hydration and physical work, continue with IV fluid, monitor renal function closely, check BMP and CK in a.m.  Chest pain - Nontypical, cardiac enzymes negative, no events on telemetry,, follow on 2-D echo  Acute renal failure - Secondary to rhabdomyolysis, improving with IV fluids, hold nephrotoxic medication  Hypercalcemia - Resolved with hydration   hypokalemia  - Repleted     Code Status : none at bedside  Family Communication  : D/W patient   Disposition Plan  : Home   Consults  :  none  Procedures  : None  DVT Prophylaxis  :  Lovenox -  SCDs   Lab Results  Component Value Date   PLT 188 05/17/2016    Antibiotics  :   Anti-infectives    None        Objective:   Vitals:   05/17/16 0900 05/17/16 1029 05/17/16 1032 05/17/16 1057  BP:  126/70 140/76 140/77    Pulse: 65  73 77  Resp:    16  Temp:    97.5 F (36.4 C)  TempSrc:    Oral  SpO2: 99%   100%  Weight:  Height:        Wt Readings from Last 3 Encounters:  05/16/16 130.6 kg (288 lb)  09/29/12 130.6 kg (288 lb)     Intake/Output Summary (Last 24 hours) at 05/17/16 1436 Last data filed at 05/17/16 0900  Gross per 24 hour  Intake             2018 ml  Output              275 ml  Net             1743 ml     Physical Exam  Awake Alert, Oriented X 3, No new F.N deficits, Normal affect [place].AT,PERRAL Supple Neck,No JVD, No cervical lymphadenopathy appriciated.  Symmetrical Chest wall movement, Good air movement bilaterally, CTAB RRR,No Gallops,Rubs or new Murmurs, No Parasternal Heave +ve B.Sounds, Abd Soft, No tenderness, No organomegaly appriciated, No rebound - guarding or rigidity. No Cyanosis, Clubbing or edema, No new Rash or bruise , muscle tender to palpation    Data Review:    CBC  Recent Labs Lab 05/16/16 2023 05/17/16 0513  WBC 11.0* 8.6  HGB 16.8 15.0  HCT 47.5 44.3  PLT 196 188  MCV 86.4 89.1  MCH 30.5 30.2  MCHC 35.4 33.9  RDW 13.0 13.3  LYMPHSABS 1.6  --   MONOABS 1.4*  --   EOSABS 0.0  --   BASOSABS 0.0  --     Chemistries   Recent Labs Lab 05/16/16 2023 05/17/16 0513  NA 142 140  K 3.2* 4.3  CL 106 115*  CO2 26 23  GLUCOSE 76 106*  BUN 19 18  CREATININE 2.18* 1.62*  CALCIUM 10.6* 8.8*  MG 2.4 2.1  AST 27 39  ALT 22 21  ALKPHOS 60 52  BILITOT 0.9 0.8   ------------------------------------------------------------------------------------------------------------------ No results for input(s): CHOL, HDL, LDLCALC, TRIG, CHOLHDL, LDLDIRECT in the last 72 hours.  No results found for: HGBA1C ------------------------------------------------------------------------------------------------------------------  Recent Labs  05/17/16 0513  TSH 0.730    ------------------------------------------------------------------------------------------------------------------ No results for input(s): VITAMINB12, FOLATE, FERRITIN, TIBC, IRON, RETICCTPCT in the last 72 hours.  Coagulation profile No results for input(s): INR, PROTIME in the last 168 hours.   Recent Labs  05/16/16 2023  DDIMER <0.27    Cardiac Enzymes  Recent Labs Lab 05/17/16 0513 05/17/16 1117  TROPONINI <0.03 <0.03   ------------------------------------------------------------------------------------------------------------------ No results found for: BNP  Inpatient Medications  Scheduled Meds: . enoxaparin (LOVENOX) injection  40 mg Subcutaneous Q breakfast  . sodium chloride flush  3 mL Intravenous Q12H   Continuous Infusions: . sodium chloride 150 mL/hr at 05/17/16 0958   PRN Meds:.acetaminophen **OR** acetaminophen, HYDROcodone-acetaminophen, methocarbamol, ondansetron **OR** ondansetron (ZOFRAN) IV  Micro Results Recent Results (from the past 240 hour(s))  MRSA PCR Screening     Status: None   Collection Time: 05/17/16  3:48 AM  Result Value Ref Range Status   MRSA by PCR NEGATIVE NEGATIVE Final    Comment:        The GeneXpert MRSA Assay (FDA approved for NASAL specimens only), is one component of a comprehensive MRSA colonization surveillance program. It is not intended to diagnose MRSA infection nor to guide or monitor treatment for MRSA infections.     Radiology Reports Dg Chest 2 View  Result Date: 05/16/2016 CLINICAL DATA:  Chest pain and shortness of breath for 2 weeks EXAM: CHEST  2 VIEW COMPARISON:  None. FINDINGS: Lungs are clear. Heart size and pulmonary vascularity are normal. No adenopathy.  No pneumothorax. No bone lesions. IMPRESSION: No edema or consolidation. Electronically Signed   By: Bretta BangWilliam  Woodruff III M.D.   On: 05/16/2016 20:58   Ct Angio Chest/abd/pel For Dissection W And/or Wo Contrast  Result Date:  05/16/2016 CLINICAL DATA:  Sudden onset cramping sensation in the upper extremities. Chest pain. EXAM: CT ANGIOGRAPHY CHEST, ABDOMEN AND PELVIS TECHNIQUE: Multidetector CT imaging through the chest, abdomen and pelvis was performed using the standard protocol during bolus administration of intravenous contrast. Multiplanar reconstructed images and MIPs were obtained and reviewed to evaluate the vascular anatomy. CONTRAST:  80 cc Isovue 370 IV COMPARISON:  None. FINDINGS: CTA CHEST FINDINGS Cardiovascular: Satisfactory opacification of the pulmonary arteries to the segmental level. No evidence of pulmonary embolism. Normal heart size. No pericardial effusion. No aortic dissection nor aneurysm. Cardiac related motion artifacts are noted the ascending aorta. Mediastinum/Nodes: There is some retained oral contrast seen in the esophagus. Delayed esophageal emptying versus reflux. No mediastinal adenopathy. 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 Lungs/Pleura: There is bibasilar dependent atelectasis. Musculoskeletal: No chest wall abnormality. No acute or significant osseous findings. Review of the MIP images confirms the above findings. CTA ABDOMEN AND PELVIS FINDINGS VASCULAR Aorta: Normal caliber aorta without aneurysm, dissection, vasculitis or significant stenosis. Celiac: Patent without evidence of aneurysm, dissection, vasculitis or significant stenosis. SMA: Patent without evidence of aneurysm, dissection, vasculitis or significant stenosis. Renals: Both renal arteries are patent without evidence of aneurysm, dissection, vasculitis, fibromuscular dysplasia or significant stenosis. IMA: Patent without evidence of aneurysm, dissection, vasculitis or significant stenosis. Inflow: Patent without evidence of aneurysm, dissection, vasculitis or significant stenosis. Veins: No obvious venous abnormality within the limitations of this arterial phase study.  Review of the MIP images confirms the above findings. NON-VASCULAR Hepatobiliary: No focal liver abnormality is seen. No gallstones, gallbladder wall thickening, or biliary dilatation. Pancreas: Unremarkable. No pancreatic ductal dilatation or surrounding inflammatory changes. Spleen: Normal in size without focal abnormality. Adrenals/Urinary Tract: Adrenal glands are unremarkable. There is a parapelvic cyst of the left kidney. This measures approximately 3.3 x 1.9 x 1.7 cm the bladder is unremarkable. No obstructive uropathy. Stomach/Bowel: A normal bowel rotation. No acute inflammation or bowel obstruction is seen. The appendix is normal. Lymphatic: No lymphadenopathy. Reproductive: Prostate is unremarkable. Other: No abdominal wall hernia or abnormality. No abdominopelvic ascites. Musculoskeletal: No acute or significant osseous findings. There is pseudoarticulation of the left L5 transverse process with S1. Review of the MIP images confirms the above findings. IMPRESSION: No aortic dissection nor large central pulmonary embolus. Electronically Signed   By: Tollie Ethavid  Kwon M.D.   On: 05/16/2016 23:51   No Sherald Hessharge  Jamiel Goncalves M.D on 05/17/2016 at 2:36 PM  Between 7am to 7pm - Pager - [phone]  After 7pm go to [email or web address] - password [email or web address]  [facility] -  Office  [phone]

## 2016-05-17 NOTE — H&P (Signed)
Chris Williams AVW:[id number] DOB: 1971-04-01 DOA: 05/16/2016     PCP: Pcp Not In System   Outpatient Specialists: none   Patient coming from:  home Lives   With family    Chief Complaint: leg cramps  HPI: Chris Williams is a 45 y.o. male with medical history significant of kidney stones    Presented with cramping sensation of lower extremities involving bilateral feet and thighs as well as "electic shock" like pains of both arms radiating to anterior chest. Describes sharp pain in his left shoulder. HE has occasional sharp pain in his chest lasting 20 sec at a time sometimes at rest. She was in significant pain he was diaphoretic nothing seemed to make the pain any better. No associated abdominal pain wife called 911. The patient has been having intermittent chest pain for the past 2 months distal quad this tightness and worsening with exertion. His been lightheaded. Symptoms started when he got out of bathtub around 6:30 PM. Patient attempted to take Tylenol) 325 of aspirin on arrival by EMS he was given 500 and min liter bolus chest tightness improved slightly with aspirin. 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 No nausea no vomiting no abdominal discomfort. HE reports drinks at least 2 bottles of water and some juice. He does lifting but have not changed his routine. Denies URI. No sick contacts.   Patient does report family history of cardiac problems      IN ER:  Temp (24hrs), Avg:97.9 F (36.6 C), Min:97.9 F (36.6 C), Max:97.9 F (36.6 C)      RR 18, 99% on RA, HR 67 BP 124/72  Troponin negative X2 WBC 11 Hg 16.8 Plt 196  K 3.2 Cr up to 2.18 Ca 10.6 CK 660 d.dimer negative CT angio ne dissection no PE CXR neg Following Medications were ordered in ER: Medications  sodium chloride 0.9 % bolus 1,000 mL (0 mLs Intravenous Stopped 05/16/16 2205)  fentaNYL (SUBLIMAZE) injection 25 mcg (25 mcg Intravenous Given 05/16/16 2034)  potassium  chloride SA (K-DUR,KLOR-CON) CR tablet 60 mEq (60 mEq Oral Given 05/16/16 2204)  sodium chloride 0.9 % bolus 1,000 mL (0 mLs Intravenous Stopped 05/16/16 2332)  ketorolac (TORADOL) 15 MG/ML injection 15 mg (15 mg Intravenous Given 05/16/16 2223)  iopamidol (ISOVUE-370) 76 % injection 100 mL (80 mLs Intravenous Contrast Given 05/16/16 2318)      Hospitalist was called for admission for Severe dehydration mild rhabdomyolysis and muscle spasms  Review of Systems:    Pertinent positives include: fatigue, muscle cramping, lightheadedness  Constitutional:  No weight loss, night sweats, Fevers, chills, weight loss  HEENT:  No headaches, Difficulty swallowing,Tooth/dental problems,Sore throat,  No sneezing, itching, ear ache, nasal congestion, post nasal drip,  Cardio-vascular:  No chest pain, Orthopnea, PND, anasarca, dizziness, palpitations.no Bilateral lower extremity swelling  GI:  No heartburn, indigestion, abdominal pain, nausea, vomiting, diarrhea, change in bowel habits, loss of appetite, melena, blood in stool, hematemesis Resp:  no shortness of breath at rest. No dyspnea on exertion, No excess mucus, no productive cough, No non-productive cough, No coughing up of blood.No change in color of mucus.No wheezing. Skin:  no rash or lesions. No jaundice GU:  no dysuria, change in color of urine, no urgency or frequency. No straining to urinate.  No flank pain.  Musculoskeletal:  No joint pain or no joint swelling. No decreased range of motion. No back pain.  Psych:  No change in mood or  affect. No depression or anxiety. No memory loss.  Neuro: no localizing neurological complaints, no tingling, no weakness, no double vision, no gait abnormality, no slurred speech, no confusion  As per HPI otherwise 10 point review of systems negative.   Past Medical History: Past Medical History:  Diagnosis Date  . Kidney stone    Past Surgical History:  Procedure Laterality Date  .  CYSTOSCOPY/RETROGRADE/URETEROSCOPY/STONE EXTRACTION WITH BASKET Right 09/29/2012   Procedure: CYSTOSCOPY/RETROGRADE/URETEROSCOPY/STONE EXTRACTION WITH BASKET;  Surgeon: Anner Crete, MD;  Location: [facility];  Service: Urology;  Laterality: Right;  . HOLMIUM LASER APPLICATION Right 09/29/2012   Procedure: HOLMIUM LASER APPLICATION;  Surgeon: Anner Crete, MD;  Location: [facility];  Service: Urology;  Laterality: Right;     Social History:  Ambulatory   independently      reports that he has never smoked. He has never used smokeless tobacco. He reports that he does not drink alcohol or use drugs.  Allergies:  No Known Allergies     Family History:   Family History  Problem Relation Age of Onset  . Hypertension Father   . CAD Chris   . Deep vein thrombosis Chris     Medications: Prior to Admission medications   Not on File    Physical Exam: Patient Vitals for the past 24 hrs:  BP Temp Temp src Pulse Resp SpO2 Height Weight  05/16/16 2332 124/72 97.9 F (36.6 C) Oral 67 18 99 % - -  05/16/16 2200 96/62 - - 64 17 99 % - -  05/16/16 2145 - - - 63 18 98 % - -  05/16/16 2130 100/70 - - 64 20 97 % - -  05/16/16 2100 (!) 122/104 - - 69 18 98 % - -  05/16/16 2058 106/65 - - - - - - -  05/16/16 2056 129/77 - - - - - - -  05/16/16 1933 122/74 97.9 F (36.6 C) Oral 84 18 98 % - -  05/16/16 1931 - - - - - - 6\' 1"  (1.854 m) 130.6 kg (288 lb)    1. General:  in No Acute distress 2. Psychological: Alert and   Oriented 3. Head/ENT:     Dry Mucous Membranes                          Head Non traumatic, neck supple                          Normal   Dentition 4. SKIN:  decreased Skin turgor,  Skin clean Dry and intact no rash 5. Heart: Regular rate and rhythm no  Murmur, Rub or gallop 6. Lungs:  Clear to auscultation bilaterally, no wheezes or crackles   7. Abdomen: Soft, non-tender, Non distended 8. Lower extremities: no clubbing, cyanosis, or edema 9. Neurologically  strength 5 out of  5 in all 4 extremities cranial nerves II through XII intact 10. MSK: Normal range of motion, pain in chest reproducible by palpation and pectoral muscle involvement.    body mass index is 38 kg/m.  Labs on Admission:   Labs on Admission: I have personally reviewed following labs and imaging studies  CBC:  Recent Labs Lab 05/16/16 2023  WBC 11.0*  NEUTROABS 8.0*  HGB 16.8  HCT 47.5  MCV 86.4  PLT 196   Basic Metabolic Panel:  Recent Labs Lab 05/16/16 2023  NA 142  K 3.2*  CL 106  CO2 26  GLUCOSE 76  BUN 19  CREATININE 2.18*  CALCIUM 10.6*   GFR: Estimated Creatinine Clearance: 61.3 mL/min (by C-G formula based on SCr of 2.18 mg/dL (H)). Liver Function Tests: No results for input(s): AST, ALT, ALKPHOS, BILITOT, PROT, ALBUMIN in the last 168 hours. No results for input(s): LIPASE, AMYLASE in the last 168 hours. No results for input(s): AMMONIA in the last 168 hours. Coagulation Profile: No results for input(s): INR, PROTIME in the last 168 hours. Cardiac Enzymes:  Recent Labs Lab 05/16/16 2023  CKTOTAL 660*   BNP (last 3 results) No results for input(s): PROBNP in the last 8760 hours. HbA1C: No results for input(s): HGBA1C in the last 72 hours. CBG: No results for input(s): GLUCAP in the last 168 hours. Lipid Profile: No results for input(s): CHOL, HDL, LDLCALC, TRIG, CHOLHDL, LDLDIRECT in the last 72 hours. Thyroid Function Tests: No results for input(s): TSH, T4TOTAL, FREET4, T3FREE, THYROIDAB in the last 72 hours. Anemia Panel: No results for input(s): VITAMINB12, FOLATE, FERRITIN, TIBC, IRON, RETICCTPCT in the last 72 hours.  Sepsis Labs: @LABRCNTIP (procalcitonin:4,lacticidven:4) )No results found for this or any previous visit (from the past 240 hour(s)).     UA   ordered   No results found for: HGBA1C  Estimated Creatinine Clearance: 61.3 mL/min (by C-G formula based on SCr of 2.18 mg/dL (H)).  BNP (last 3 results) No results for input(s):  PROBNP in the last 8760 hours.   ECG REPORT  Independently reviewed Rate: 77  Rhythm: Normal sinus rhythm ST&T Change: No acute ischemic changes   QTC 441  Filed Weights   05/16/16 1931  Weight: 130.6 kg (288 lb)     Cultures: No results found for: SDES, SPECREQUEST, CULT, REPTSTATUS   Radiological Exams on Admission: Dg Chest 2 View  Result Date: 05/16/2016 CLINICAL DATA:  Chest pain and shortness of breath for 2 weeks EXAM: CHEST  2 VIEW COMPARISON:  None. FINDINGS: Lungs are clear. Heart size and pulmonary vascularity are normal. No adenopathy. No pneumothorax. No bone lesions. IMPRESSION: No edema or consolidation. Electronically Signed   By: Bretta BangWilliam  Woodruff III M.D.   On: 05/16/2016 20:58   Ct Angio Chest/abd/pel For Dissection W And/or Wo Contrast  Result Date: 05/16/2016 CLINICAL DATA:  Sudden onset cramping sensation in the upper extremities. Chest pain. EXAM: CT ANGIOGRAPHY CHEST, ABDOMEN AND PELVIS TECHNIQUE: Multidetector CT imaging through the chest, abdomen and pelvis was performed using the standard protocol during bolus administration of intravenous contrast. Multiplanar reconstructed images and MIPs were obtained and reviewed to evaluate the vascular anatomy. CONTRAST:  80 cc Isovue 370 IV COMPARISON:  None. FINDINGS: CTA CHEST FINDINGS Cardiovascular: Satisfactory opacification of the pulmonary arteries to the segmental level. No evidence of pulmonary embolism. Normal heart size. No pericardial effusion. No aortic dissection nor aneurysm. Cardiac related motion artifacts are noted the ascending aorta. Mediastinum/Nodes: There is some retained oral contrast seen in the esophagus. Delayed esophageal emptying versus reflux. No mediastinal adenopathy. 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 Lungs/Pleura: There is bibasilar dependent atelectasis. Musculoskeletal: No chest wall abnormality. No acute or significant  osseous findings. Review of the MIP images confirms the above findings. CTA ABDOMEN AND PELVIS FINDINGS VASCULAR Aorta: Normal caliber aorta without aneurysm, dissection, vasculitis or significant stenosis. Celiac: Patent without evidence of aneurysm, dissection, vasculitis or significant stenosis. SMA: Patent without evidence of aneurysm, dissection, vasculitis or significant stenosis. Renals: Both renal arteries are patent  without evidence of aneurysm, dissection, vasculitis, fibromuscular dysplasia or significant stenosis. IMA: Patent without evidence of aneurysm, dissection, vasculitis or significant stenosis. Inflow: Patent without evidence of aneurysm, dissection, vasculitis or significant stenosis. Veins: No obvious venous abnormality within the limitations of this arterial phase study. Review of the MIP images confirms the above findings. NON-VASCULAR Hepatobiliary: No focal liver abnormality is seen. No gallstones, gallbladder wall thickening, or biliary dilatation. Pancreas: Unremarkable. No pancreatic ductal dilatation or surrounding inflammatory changes. Spleen: Normal in size without focal abnormality. Adrenals/Urinary Tract: Adrenal glands are unremarkable. There is a parapelvic cyst of the left kidney. This measures approximately 3.3 x 1.9 x 1.7 cm the bladder is unremarkable. No obstructive uropathy. Stomach/Bowel: A normal bowel rotation. No acute inflammation or bowel obstruction is seen. The appendix is normal. Lymphatic: No lymphadenopathy. Reproductive: Prostate is unremarkable. Chris: No abdominal wall hernia or abnormality. No abdominopelvic ascites. Musculoskeletal: No acute or significant osseous findings. There is pseudoarticulation of the left L5 transverse process with S1. Review of the MIP images confirms the above findings. IMPRESSION: No aortic dissection nor large central pulmonary embolus. Electronically Signed   By: Tollie Ethavid  Kwon M.D.   On: 05/16/2016 23:51    Chart has been  reviewed    Assessment/Plan   45 y.o. male with medical history significant of kidney stones being admitted for acute renal failure associated with the rhabdomyolysis and severe dehydration associated muscle cramping  Present on Admission: . Hypokalemia will replete and monitor on telemetry . Acute renal failure (ARF) (HCC) likely secondary to dehydration will obtain urine electrolytes follow renal function after rehydration. If persists would need nephrology consult in renal ultrasound . Dehydration will rehydrate patient's wife suspect that he has not been drinking as much fluids due to change in his job . Rhabdomyolysis mild will rehydrate and follow CK levels . Chest pain appears to be more atypical. We'll monitor on telemetry cycle cardiac enzymes given occasional exertional component to also order a echo to evaluate for any wall motion abnormalities. Patient will probably benefit from stress testing once acute illness resolves. . Hypercalcemia mild likely in the setting of dehydration will repeat after patient has been aggressively rehydrated   Chris plan as per orders.  DVT prophylaxis:    Lovenox     Code Status:  FULL CODE   as per patient    Family Communication:   Family   at  Bedside  plan of care was discussed with Wife   Disposition Plan:     To home once workup is complete and patient is stable                              Consults called: none  Admission status:    obs   Level of care tele         I have spent a total of 56 min on this admission   Sigrid Schwebach 05/17/2016, 1:06 AM    [facility]  Pager [phone]   after 2 AM please page floor coverage PA If 7AM-7PM, please contact the day team taking care of the patient  [email or web address]  Password [email or web address]

## 2016-05-17 NOTE — Progress Notes (Signed)
Echocardiogram 2D Echocardiogram has been performed.  Marisue Humblelexis N Elliona Doddridge 05/17/2016, 3:28 PM

## 2016-05-18 LAB — BASIC METABOLIC PANEL
Anion gap: 4 — ABNORMAL LOW (ref 5–15)
BUN: 12 mg/dL (ref 6–20)
CHLORIDE: 112 mmol/L — AB (ref 101–111)
CO2: 25 mmol/L (ref 22–32)
CREATININE: 1.52 mg/dL — AB (ref 0.61–1.24)
Calcium: 8.5 mg/dL — ABNORMAL LOW (ref 8.9–10.3)
GFR calc Af Amer: >60 mL/min (ref >60)
GFR calc non Af Amer: 54 mL/min — ABNORMAL LOW (ref >60)
Glucose, Bld: 105 mg/dL — ABNORMAL HIGH (ref 65–99)
Potassium: 3.8 mmol/L (ref 3.5–5.1)
SODIUM: 141 mmol/L (ref 135–145)

## 2016-05-18 LAB — CK: Total CK: 1884 U/L — ABNORMAL HIGH (ref 49–397)

## 2016-05-18 LAB — CALCIUM, IONIZED: Calcium, Ionized, Serum: 5.4 mg/dL (ref 4.5–5.6)

## 2016-05-18 MED ORDER — POTASSIUM CHLORIDE CRYS ER 20 MEQ PO TBCR
40.0000 meq | EXTENDED_RELEASE_TABLET | Freq: Once | ORAL | Status: AC
Start: 2016-05-18 — End: 2016-05-18
  Administered 2016-05-18: 40 meq via ORAL
  Filled 2016-05-18: qty 2

## 2016-05-18 NOTE — Progress Notes (Signed)
PROGRESS NOTE                                                                                                                                                                                                             Patient Demographics:    Chris Williams, is a 45 y.o. male, DOB - Dec 07, 1970, ZOX:[id number]  Admit date - 05/16/2016   Admitting Physician Therisa Doyne, MD  Outpatient Primary MD for the patient is Pcp Not In System  LOS - 1    Chief Complaint  Patient presents with  . Extremity Pain       Brief Narrative   Patient admitted earlier during the day by my colleague, chart, labs and imaging were reviewed, workup significant for rhabdomyolysis   Subjective:    Chris Williams today has, No headache, No chest pain, No abdominal pain - But reports generalized muscle ache  Assessment  & Plan :    Active Problems:   Hypokalemia   Acute renal failure (ARF) (HCC)   Dehydration   Rhabdomyolysis   Chest pain   Hypercalcemia   Rhabdomyolysis and dehydration - Reports generalized body pain, significantly elevated CK at 1800, possibly related to dehydration and physical work, and 150 mL/h IV normal saline, total CK remains significantly elevated today, but at least is not increasing, so we'll increase IV fluid 200 mL/h, and recheck in a.m. renal function improving, as well patient reports Myalagia  significantly subsided  Chest pain - Nontypical, Musculoskeletal, secondary to generalized body ache, cardiac enzymes negative, no events on telemetry, 2-D echo with EF 55%, no regional wall motion abnormality  Acute renal failure - Secondary to rhabdomyolysis, improving with IV fluids, hold nephrotoxic medication  Hypercalcemia - Resolved with hydration   hypokalemia  - Repleted     Code Status : none at bedside  Family Communication  : D/W patient   Disposition Plan  : Home   Consults  :  none  Procedures  : None  DVT  Prophylaxis  :  Lovenox -  SCDs   Lab Results  Component Value Date   PLT 188 05/17/2016    Antibiotics  :   Anti-infectives    None        Objective:   Vitals:   05/17/16 1057 05/17/16 2042 05/18/16 0000 05/18/16 0458  BP: 140/77 134/88 127/83 Marland Kitchen)  143/91  Pulse: 77 (!) 53 67 63  Resp: 16 17 17 17   Temp: 97.5 F (36.4 C) 98.4 F (36.9 C) 98.1 F (36.7 C) 98.2 F (36.8 C)  TempSrc: Oral Oral Oral Oral  SpO2: 100% 100% 100% 100%  Weight:      Height:        Wt Readings from Last 3 Encounters:  05/16/16 130.6 kg (288 lb)  09/29/12 130.6 kg (288 lb)     Intake/Output Summary (Last 24 hours) at 05/18/16 1457 Last data filed at 05/18/16 0900  Gross per 24 hour  Intake             3330 ml  Output                0 ml  Net             3330 ml     Physical Exam  Awake Alert, Oriented X 3, No new F.N deficits, Normal affect [place].AT,PERRAL Supple Neck,No JVD, No cervical lymphadenopathy appriciated.  Symmetrical Chest wall movement, Good air movement bilaterally, CTAB RRR,No Gallops,Rubs or new Murmurs, No Parasternal Heave +ve B.Sounds, Abd Soft, No tenderness, No organomegaly appriciated, No rebound - guarding or rigidity. No Cyanosis, Clubbing or edema, No new Rash or bruise , muscle much less tender to palpation    Data Review:    CBC  Recent Labs Lab 05/16/16 2023 05/17/16 0513  WBC 11.0* 8.6  HGB 16.8 15.0  HCT 47.5 44.3  PLT 196 188  MCV 86.4 89.1  MCH 30.5 30.2  MCHC 35.4 33.9  RDW 13.0 13.3  LYMPHSABS 1.6  --   MONOABS 1.4*  --   EOSABS 0.0  --   BASOSABS 0.0  --     Chemistries   Recent Labs Lab 05/16/16 2023 05/17/16 0513 05/18/16 0449  NA 142 140 141  K 3.2* 4.3 3.8  CL 106 115* 112*  CO2 26 23 25   GLUCOSE 76 106* 105*  BUN 19 18 12   CREATININE 2.18* 1.62* 1.52*  CALCIUM 10.6* 8.8* 8.5*  MG 2.4 2.1  --   AST 27 39  --   ALT 22 21  --   ALKPHOS 60 52  --   BILITOT 0.9 0.8  --     ------------------------------------------------------------------------------------------------------------------ No results for input(s): CHOL, HDL, LDLCALC, TRIG, CHOLHDL, LDLDIRECT in the last 72 hours.  No results found for: HGBA1C ------------------------------------------------------------------------------------------------------------------  Recent Labs  05/17/16 0513  TSH 0.730   ------------------------------------------------------------------------------------------------------------------ No results for input(s): VITAMINB12, FOLATE, FERRITIN, TIBC, IRON, RETICCTPCT in the last 72 hours.  Coagulation profile No results for input(s): INR, PROTIME in the last 168 hours.   Recent Labs  05/16/16 2023  DDIMER <0.27    Cardiac Enzymes  Recent Labs Lab 05/17/16 0513 05/17/16 1117  TROPONINI <0.03 <0.03   ------------------------------------------------------------------------------------------------------------------ No results found for: BNP  Inpatient Medications  Scheduled Meds: . enoxaparin (LOVENOX) injection  40 mg Subcutaneous Q breakfast  . sodium chloride flush  3 mL Intravenous Q12H   Continuous Infusions: . sodium chloride 150 mL/hr at 05/18/16 1320   PRN Meds:.acetaminophen **OR** acetaminophen, HYDROcodone-acetaminophen, methocarbamol, ondansetron **OR** ondansetron (ZOFRAN) IV  Micro Results Recent Results (from the past 240 hour(s))  MRSA PCR Screening     Status: None   Collection Time: 05/17/16  3:48 AM  Result Value Ref Range Status   MRSA by PCR NEGATIVE NEGATIVE Final    Comment:        The GeneXpert MRSA Assay (  FDA approved for NASAL specimens only), is one component of a comprehensive MRSA colonization surveillance program. It is not intended to diagnose MRSA infection nor to guide or monitor treatment for MRSA infections.     Radiology Reports Dg Chest 2 View  Result Date: 05/16/2016 CLINICAL DATA:  Chest pain and  shortness of breath for 2 weeks EXAM: CHEST  2 VIEW COMPARISON:  None. FINDINGS: Lungs are clear. Heart size and pulmonary vascularity are normal. No adenopathy. No pneumothorax. No bone lesions. IMPRESSION: No edema or consolidation. Electronically Signed   By: Bretta BangWilliam  Woodruff III M.D.   On: 05/16/2016 20:58   Ct Angio Chest/abd/pel For Dissection W And/or Wo Contrast  Result Date: 05/16/2016 CLINICAL DATA:  Sudden onset cramping sensation in the upper extremities. Chest pain. EXAM: CT ANGIOGRAPHY CHEST, ABDOMEN AND PELVIS TECHNIQUE: Multidetector CT imaging through the chest, abdomen and pelvis was performed using the standard protocol during bolus administration of intravenous contrast. Multiplanar reconstructed images and MIPs were obtained and reviewed to evaluate the vascular anatomy. CONTRAST:  80 cc Isovue 370 IV COMPARISON:  None. FINDINGS: CTA CHEST FINDINGS Cardiovascular: Satisfactory opacification of the pulmonary arteries to the segmental level. No evidence of pulmonary embolism. Normal heart size. No pericardial effusion. No aortic dissection nor aneurysm. Cardiac related motion artifacts are noted the ascending aorta. Mediastinum/Nodes: There is some retained oral contrast seen in the esophagus. Delayed esophageal emptying versus reflux. No mediastinal adenopathy. 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 Lungs/Pleura: There is bibasilar dependent atelectasis. Musculoskeletal: No chest wall abnormality. No acute or significant osseous findings. Review of the MIP images confirms the above findings. CTA ABDOMEN AND PELVIS FINDINGS VASCULAR Aorta: Normal caliber aorta without aneurysm, dissection, vasculitis or significant stenosis. Celiac: Patent without evidence of aneurysm, dissection, vasculitis or significant stenosis. SMA: Patent without evidence of aneurysm, dissection, vasculitis or significant stenosis. Renals: Both renal  arteries are patent without evidence of aneurysm, dissection, vasculitis, fibromuscular dysplasia or significant stenosis. IMA: Patent without evidence of aneurysm, dissection, vasculitis or significant stenosis. Inflow: Patent without evidence of aneurysm, dissection, vasculitis or significant stenosis. Veins: No obvious venous abnormality within the limitations of this arterial phase study. Review of the MIP images confirms the above findings. NON-VASCULAR Hepatobiliary: No focal liver abnormality is seen. No gallstones, gallbladder wall thickening, or biliary dilatation. Pancreas: Unremarkable. No pancreatic ductal dilatation or surrounding inflammatory changes. Spleen: Normal in size without focal abnormality. Adrenals/Urinary Tract: Adrenal glands are unremarkable. There is a parapelvic cyst of the left kidney. This measures approximately 3.3 x 1.9 x 1.7 cm the bladder is unremarkable. No obstructive uropathy. Stomach/Bowel: A normal bowel rotation. No acute inflammation or bowel obstruction is seen. The appendix is normal. Lymphatic: No lymphadenopathy. Reproductive: Prostate is unremarkable. Other: No abdominal wall hernia or abnormality. No abdominopelvic ascites. Musculoskeletal: No acute or significant osseous findings. There is pseudoarticulation of the left L5 transverse process with S1. Review of the MIP images confirms the above findings. IMPRESSION: No aortic dissection nor large central pulmonary embolus. Electronically Signed   By: Tollie Ethavid  Kwon M.D.   On: 05/16/2016 23:51   No Sherald Hessharge  ELGERGAWY, DAWOOD M.D on 05/18/2016 at 2:57 PM  Between 7am to 7pm - Pager - [phone]  After 7pm go to [email or web address] - password [email or web address]  [facility] -  Office  [phone]

## 2016-05-19 LAB — BASIC METABOLIC PANEL
Anion gap: 2 — ABNORMAL LOW (ref 5–15)
BUN: 13 mg/dL (ref 6–20)
CO2: 28 mmol/L (ref 22–32)
Calcium: 8.8 mg/dL — ABNORMAL LOW (ref 8.9–10.3)
Chloride: 114 mmol/L — ABNORMAL HIGH (ref 101–111)
Creatinine, Ser: 1.38 mg/dL — ABNORMAL HIGH (ref 0.61–1.24)
GFR calc Af Amer: >60 mL/min (ref >60)
Glucose, Bld: 104 mg/dL — ABNORMAL HIGH (ref 65–99)
POTASSIUM: 4.3 mmol/L (ref 3.5–5.1)
SODIUM: 144 mmol/L (ref 135–145)

## 2016-05-19 LAB — TROPONIN I: Troponin I: <0.03 ng/mL (ref <0.03)

## 2016-05-19 LAB — CK: CK TOTAL: 1005 U/L — AB (ref 49–397)

## 2016-05-19 MED ORDER — ACETAMINOPHEN 325 MG PO TABS: 650.0000 mg | ORAL_TABLET | Freq: Four times a day (QID) | ORAL | Status: DC | PRN

## 2016-05-19 NOTE — Discharge Instructions (Signed)
Follow with Primary MD in 7 days   Get CBC, CMP , Total CK checked  by Primary MD next visit.    Activity: As tolerated    Disposition Home    Diet: Regular diet   On your next visit with your primary care physician please Get Medicines reviewed and adjusted.   Please request your Prim.MD to go over all Hospital Tests and Procedure/Radiological results at the follow up, please get all Hospital records sent to your Prim MD by signing hospital release before you go home.   If you experience worsening of your admission symptoms, develop shortness of breath, life threatening emergency, suicidal or homicidal thoughts you must seek medical attention immediately by calling 911 or calling your MD immediately  if symptoms less severe.  You Must read complete instructions/literature along with all the possible adverse reactions/side effects for all the Medicines you take and that have been prescribed to you. Take any new Medicines after you have completely understood and accpet all the possible adverse reactions/side effects.   Do not drive, operating heavy machinery, perform activities at heights, swimming or participation in water activities or provide baby sitting services if your were admitted for syncope or siezures until you have seen by Primary MD or a Neurologist and advised to do so again.  Do not drive when taking Pain medications.    Do not take more than prescribed Pain, Sleep and Anxiety Medications  Special Instructions: If you have smoked or chewed Tobacco  in the last 2 yrs please stop smoking, stop any regular Alcohol  and or any Recreational drug use.  Wear Seat belts while driving.   Please note  You were cared for by a hospitalist during your hospital stay. If you have any questions about your discharge medications or the care you received while you were in the hospital after you are discharged, you can call the unit and asked to speak with the hospitalist on call if  the hospitalist that took care of you is not available. Once you are discharged, your primary care physician will handle any further medical issues. Please note that NO REFILLS for any discharge medications will be authorized once you are discharged, as it is imperative that you return to your primary care physician (or establish a relationship with a primary care physician if you do not have one) for your aftercare needs so that they can reassess your need for medications and monitor your lab values.

## 2016-05-19 NOTE — Progress Notes (Signed)
Patient discharged.  Leaving with one prescription, return to work note, and personal belongings.  Accompanied by Mother, Father, and Significant Other.  Room air, no s/s of pain or distress.  Reports understanding of discharge instructions.  No complaints.

## 2016-05-19 NOTE — Discharge Summary (Signed)
Chris Williams, is a 45 y.o. male  DOB 05-Nov-1970  MRN [id number].  Admission date:  05/16/2016  Admitting Physician  Therisa Doyne, MD  Discharge Date:  05/19/2016   Primary MD  Pcp Not In System  Recommendations for primary care physician for things to follow:  - please check  CBC, CMP, total CK in one week  Admission Diagnosis  Myalgia [M79.1] Atypical chest pain [R07.89] AKI (acute kidney injury) (HCC) [N17.9]   Discharge Diagnosis  Myalgia [M79.1] Atypical chest pain [R07.89] AKI (acute kidney injury) (HCC) [N17.9]    Active Problems:   Hypokalemia   Acute renal failure (ARF) (HCC)   Dehydration   Rhabdomyolysis   Chest pain   Hypercalcemia      Past Medical History:  Diagnosis Date  . Kidney stone     Past Surgical History:  Procedure Laterality Date  . CYSTOSCOPY/RETROGRADE/URETEROSCOPY/STONE EXTRACTION WITH BASKET Right 09/29/2012   Procedure: CYSTOSCOPY/RETROGRADE/URETEROSCOPY/STONE EXTRACTION WITH BASKET;  Surgeon: Anner Crete, MD;  Location: [facility];  Service: Urology;  Laterality: Right;  . HOLMIUM LASER APPLICATION Right 09/29/2012   Procedure: HOLMIUM LASER APPLICATION;  Surgeon: Anner Crete, MD;  Location: [facility];  Service: Urology;  Laterality: Right;       History of present illness and  Hospital Course:     Kindly see H&P for history of present illness and admission details, please review complete Labs, Consult reports and Test reports for all details in brief  HPI  from the history and physical done on the day of admission 05/17/2016  HPI: Chris Williams is a 45 y.o. male with medical history significant of kidney stones    Presented with cramping sensation of lower extremities involving bilateral feet and thighs as well as "electic shock" like pains of both arms radiating to anterior chest. Describes sharp pain in his left shoulder. HE has occasional sharp pain  in his chest lasting 20 sec at a time sometimes at rest. She was in significant pain he was diaphoretic nothing seemed to make the pain any better. No associated abdominal pain wife called 911. The patient has been having intermittent chest pain for the past 2 months distal quad this tightness and worsening with exertion. His been lightheaded. Symptoms started when he got out of bathtub around 6:30 PM. Patient attempted to take Tylenol) 325 of aspirin on arrival by EMS he was given 500 and min liter bolus chest tightness improved slightly with aspirin. 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 No nausea no vomiting no abdominal discomfort. HE reports drinks at least 2 bottles of water and some juice. He does lifting but have not changed his routine. Denies URI. No sick contacts.   Patient does report family history of cardiac problems      IN ER:  Temp (24hrs), Avg:97.9 F (36.6 C), Min:97.9 F (36.6 C), Max:97.9 F (36.6 C)      RR 18, 99% on RA, HR 67 BP 124/72  Troponin negative X2 WBC  11 Hg 16.8 Plt 196  K 3.2 Cr up to 2.18 Ca 10.6 CK 660 d.dimer negative CT angio ne dissection no PE CXR neg Following Medications were ordered in ER: Medications  sodium chloride 0.9 % bolus 1,000 mL (0 mLs Intravenous Stopped 05/16/16 2205)  fentaNYL (SUBLIMAZE) injection 25 mcg (25 mcg Intravenous Given 05/16/16 2034)  potassium chloride SA (K-DUR,KLOR-CON) CR tablet 60 mEq (60 mEq Oral Given 05/16/16 2204)  sodium chloride 0.9 % bolus 1,000 mL (0 mLs Intravenous Stopped 05/16/16 2332)  ketorolac (TORADOL) 15 MG/ML injection 15 mg (15 mg Intravenous Given 05/16/16 2223)  iopamidol (ISOVUE-370) 76 % injection 100 mL (80 mLs Intravenous Contrast Given 05/16/16 2318)    Hospitalist was called for admission for Severe dehydration mild rhabdomyolysis and muscle spasms    Hospital Course  Patient admitted earlier during the day by my colleague, chart, labs and  imaging were reviewed, workup significant for rhabdomyolysis    Rhabdomyolysis and dehydration - Reports generalized body pain, significantly elevated CK at 1800, possibly related to dehydration and physical work, patient treated with aggressive IV fluid hydration, today C total CK trending down, patient feels much better, so will discharge today , patient encouraged to increase his fluid intake, and to rest for next couple days ,    Chest pain - Nontypical, Musculoskeletal, secondary to generalized body ache, cardiac enzymes negative, no events on telemetry, 2-D echo with EF 55%, no regional wall motion abnormality -Treated with an episode of muscular skeletal chest pain today, EKG within normal limits, troponin is negative  Acute renal failure - Secondary to rhabdomyolysis, resolved with IV fluids  Hypercalcemia - Resolved with hydration   hypokalemia  - Repleted    Discharge Condition:  Stable   Follow UP    With PCP in 1 week  Discharge Instructions  and  Discharge Medications     Discharge Instructions    Discharge instructions    Complete by:  As directed    Follow with Primary MD in 7 days   Get CBC, CMP , Total CK checked  by Primary MD next visit.    Activity: As tolerated    Disposition Home    Diet: Regular diet   On your next visit with your primary care physician please Get Medicines reviewed and adjusted.   Please request your Prim.MD to go over all Hospital Tests and Procedure/Radiological results at the follow up, please get all Hospital records sent to your Prim MD by signing hospital release before you go home.   If you experience worsening of your admission symptoms, develop shortness of breath, life threatening emergency, suicidal or homicidal thoughts you must seek medical attention immediately by calling 911 or calling your MD immediately  if symptoms less severe.  You Must read complete instructions/literature along with all the  possible adverse reactions/side effects for all the Medicines you take and that have been prescribed to you. Take any new Medicines after you have completely understood and accpet all the possible adverse reactions/side effects.   Do not drive, operating heavy machinery, perform activities at heights, swimming or participation in water activities or provide baby sitting services if your were admitted for syncope or siezures until you have seen by Primary MD or a Neurologist and advised to do so again.  Do not drive when taking Pain medications.    Do not take more than prescribed Pain, Sleep and Anxiety Medications  Special Instructions: If you have smoked or chewed Tobacco  in the last  2 yrs please stop smoking, stop any regular Alcohol  and or any Recreational drug use.  Wear Seat belts while driving.   Please note  You were cared for by a hospitalist during your hospital stay. If you have any questions about your discharge medications or the care you received while you were in the hospital after you are discharged, you can call the unit and asked to speak with the hospitalist on call if the hospitalist that took care of you is not available. Once you are discharged, your primary care physician will handle any further medical issues. Please note that NO REFILLS for any discharge medications will be authorized once you are discharged, as it is imperative that you return to your primary care physician (or establish a relationship with a primary care physician if you do not have one) for your aftercare needs so that they can reassess your need for medications and monitor your lab values.   Increase activity slowly    Complete by:  As directed        Medication List    TAKE these medications   acetaminophen 325 MG tablet Commonly known as:  TYLENOL Take 2 tablets (650 mg total) by mouth every 6 (six) hours as needed for mild pain (or Fever >/= 101).         Diet and Activity  recommendation: See Discharge Instructions above   Consults obtained -  none   Major procedures and Radiology Reports - PLEASE review detailed and final reports for all details, in brief -      Dg Chest 2 View  Result Date: 05/16/2016 CLINICAL DATA:  Chest pain and shortness of breath for 2 weeks EXAM: CHEST  2 VIEW COMPARISON:  None. FINDINGS: Lungs are clear. Heart size and pulmonary vascularity are normal. No adenopathy. No pneumothorax. No bone lesions. IMPRESSION: No edema or consolidation. Electronically Signed   By: Bretta BangWilliam  Woodruff III M.D.   On: 05/16/2016 20:58   Ct Angio Chest/abd/pel For Dissection W And/or Wo Contrast  Result Date: 05/16/2016 CLINICAL DATA:  Sudden onset cramping sensation in the upper extremities. Chest pain. EXAM: CT ANGIOGRAPHY CHEST, ABDOMEN AND PELVIS TECHNIQUE: Multidetector CT imaging through the chest, abdomen and pelvis was performed using the standard protocol during bolus administration of intravenous contrast. Multiplanar reconstructed images and MIPs were obtained and reviewed to evaluate the vascular anatomy. CONTRAST:  80 cc Isovue 370 IV COMPARISON:  None. FINDINGS: CTA CHEST FINDINGS Cardiovascular: Satisfactory opacification of the pulmonary arteries to the segmental level. No evidence of pulmonary embolism. Normal heart size. No pericardial effusion. No aortic dissection nor aneurysm. Cardiac related motion artifacts are noted the ascending aorta. Mediastinum/Nodes: There is some retained oral contrast seen in the esophagus. Delayed esophageal emptying versus reflux. No mediastinal adenopathy. 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 Lungs/Pleura: There is bibasilar dependent atelectasis. Musculoskeletal: No chest wall abnormality. No acute or significant osseous findings. Review of the MIP images confirms the above findings. CTA ABDOMEN AND PELVIS FINDINGS VASCULAR Aorta: Normal  caliber aorta without aneurysm, dissection, vasculitis or significant stenosis. Celiac: Patent without evidence of aneurysm, dissection, vasculitis or significant stenosis. SMA: Patent without evidence of aneurysm, dissection, vasculitis or significant stenosis. Renals: Both renal arteries are patent without evidence of aneurysm, dissection, vasculitis, fibromuscular dysplasia or significant stenosis. IMA: Patent without evidence of aneurysm, dissection, vasculitis or significant stenosis. Inflow: Patent without evidence of aneurysm, dissection, vasculitis or significant stenosis. Veins: No obvious venous  abnormality within the limitations of this arterial phase study. Review of the MIP images confirms the above findings. NON-VASCULAR Hepatobiliary: No focal liver abnormality is seen. No gallstones, gallbladder wall thickening, or biliary dilatation. Pancreas: Unremarkable. No pancreatic ductal dilatation or surrounding inflammatory changes. Spleen: Normal in size without focal abnormality. Adrenals/Urinary Tract: Adrenal glands are unremarkable. There is a parapelvic cyst of the left kidney. This measures approximately 3.3 x 1.9 x 1.7 cm the bladder is unremarkable. No obstructive uropathy. Stomach/Bowel: A normal bowel rotation. No acute inflammation or bowel obstruction is seen. The appendix is normal. Lymphatic: No lymphadenopathy. Reproductive: Prostate is unremarkable. Other: No abdominal wall hernia or abnormality. No abdominopelvic ascites. Musculoskeletal: No acute or significant osseous findings. There is pseudoarticulation of the left L5 transverse process with S1. Review of the MIP images confirms the above findings. IMPRESSION: No aortic dissection nor large central pulmonary embolus. Electronically Signed   By: Tollie Eth M.D.   On: 05/16/2016 23:51    Micro Results     Recent Results (from the past 240 hour(s))  MRSA PCR Screening     Status: None   Collection Time: 05/17/16  3:48 AM    Result Value Ref Range Status   MRSA by PCR NEGATIVE NEGATIVE Final    Comment:        The GeneXpert MRSA Assay (FDA approved for NASAL specimens only), is one component of a comprehensive MRSA colonization surveillance program. It is not intended to diagnose MRSA infection nor to guide or monitor treatment for MRSA infections.        Today   Subjective:   Kendell Sagraves today has no headache,no abdominal pain, feels Much better today, had an episode muscle skeletal chest pain this a.m.  Objective:   Blood pressure (!) 141/92, pulse 64, temperature 97.5 F (36.4 C), temperature source Oral, resp. rate 18, height 6\' 1"  (1.854 m), weight 130.6 kg (288 lb), SpO2 99 %.   Intake/Output Summary (Last 24 hours) at 05/19/16 1305 Last data filed at 05/19/16 0800  Gross per 24 hour  Intake             4440 ml  Output                0 ml  Net             4440 ml    Exam Awake Alert, Oriented x 3, No new F.N deficits, Normal affect [place].AT,PERRAL Supple Neck,No JVD, No cervical lymphadenopathy appriciated.  Symmetrical Chest wall movement, Good air movement bilaterally, CTAB, reproducible chest pain on palpation RRR,No Gallops,Rubs or new Murmurs, No Parasternal Heave +ve B.Sounds, Abd Soft, Non tender, No organomegaly appriciated, No rebound -guarding or rigidity. No Cyanosis, Clubbing or edema, Muscle significantly less tender on palpation  Data Review   CBC w Diff: Lab Results  Component Value Date   WBC 8.6 05/17/2016   HGB 15.0 05/17/2016   HCT 44.3 05/17/2016   PLT 188 05/17/2016   LYMPHOPCT 15 05/16/2016   MONOPCT 13 05/16/2016   EOSPCT 0 05/16/2016   BASOPCT 0 05/16/2016    CMP: Lab Results  Component Value Date   NA 144 05/19/2016   K 4.3 05/19/2016   CL 114 (H) 05/19/2016   CO2 28 05/19/2016   BUN 13 05/19/2016   CREATININE 1.38 (H) 05/19/2016   PROT 6.8 05/17/2016   ALBUMIN 3.8 05/17/2016   BILITOT 0.8 05/17/2016   ALKPHOS 52 05/17/2016   AST 39  05/17/2016   ALT  21 05/17/2016  .   Total Time in preparing paper work, data evaluation and todays exam - 35 minutes  Ennis Heavner M.D on 05/19/2016 at 1:05 PM  [facility]   Office  [phone]

## 2016-09-26 ENCOUNTER — Other Ambulatory Visit: Payer: Self-pay | Admitting: Family

## 2016-09-26 ENCOUNTER — Ambulatory Visit
Admission: RE | Admit: 2016-09-26 | Discharge: 2016-09-26 | Disposition: A | Discharge status: Home / Self Care | Payer: Worker's Compensation | Source: Ambulatory Visit | Attending: Family | Admitting: Family

## 2016-09-26 DIAGNOSIS — M25552 Pain in left hip: Secondary | ICD-10-CM | POA: Diagnosis present

## 2016-09-26 DIAGNOSIS — W1789XA Other fall from one level to another, initial encounter: Secondary | ICD-10-CM | POA: Insufficient documentation

## 2016-09-26 DIAGNOSIS — R52 Pain, unspecified: Secondary | ICD-10-CM

## 2016-09-26 DIAGNOSIS — M25562 Pain in left knee: Secondary | ICD-10-CM | POA: Diagnosis present

## 2016-09-26 DIAGNOSIS — Y990 Civilian activity done for income or pay: Secondary | ICD-10-CM | POA: Diagnosis not present

## 2016-09-26 DIAGNOSIS — M25572 Pain in left ankle and joints of left foot: Secondary | ICD-10-CM | POA: Insufficient documentation

## 2016-09-26 DIAGNOSIS — M79662 Pain in left lower leg: Secondary | ICD-10-CM | POA: Diagnosis not present

## 2016-10-01 ENCOUNTER — Ambulatory Visit
Admission: RE | Admit: 2016-10-01 | Discharge: 2016-10-01 | Disposition: A | Discharge status: Home / Self Care | Payer: Worker's Compensation | Source: Ambulatory Visit | Attending: Family | Admitting: Family

## 2016-10-01 ENCOUNTER — Other Ambulatory Visit: Payer: Self-pay | Admitting: Family

## 2016-10-01 DIAGNOSIS — R609 Edema, unspecified: Secondary | ICD-10-CM

## 2016-10-01 DIAGNOSIS — R52 Pain, unspecified: Secondary | ICD-10-CM

## 2016-10-01 DIAGNOSIS — M7989 Other specified soft tissue disorders: Secondary | ICD-10-CM | POA: Insufficient documentation

## 2016-10-01 DIAGNOSIS — M25571 Pain in right ankle and joints of right foot: Secondary | ICD-10-CM | POA: Insufficient documentation

## 2017-04-01 ENCOUNTER — Ambulatory Visit (INDEPENDENT_AMBULATORY_CARE_PROVIDER_SITE_OTHER): Payer: PRIVATE HEALTH INSURANCE | Admitting: Family Medicine

## 2017-04-01 ENCOUNTER — Encounter: Payer: Self-pay | Admitting: Family Medicine

## 2017-04-01 VITALS — BP 134/82 | HR 95 | Temp 98.3°F | Resp 17 | Ht 72.28 in | Wt 250.2 lb

## 2017-04-01 DIAGNOSIS — R202 Paresthesia of skin: Secondary | ICD-10-CM

## 2017-04-01 DIAGNOSIS — R5382 Chronic fatigue, unspecified: Secondary | ICD-10-CM

## 2017-04-01 DIAGNOSIS — R252 Cramp and spasm: Secondary | ICD-10-CM | POA: Diagnosis not present

## 2017-04-01 DIAGNOSIS — R200 Anesthesia of skin: Secondary | ICD-10-CM

## 2017-04-01 DIAGNOSIS — Z0000 Encounter for general adult medical examination without abnormal findings: Secondary | ICD-10-CM | POA: Diagnosis not present

## 2017-04-01 DIAGNOSIS — Z125 Encounter for screening for malignant neoplasm of prostate: Secondary | ICD-10-CM | POA: Diagnosis not present

## 2017-04-01 DIAGNOSIS — Z13220 Encounter for screening for lipoid disorders: Secondary | ICD-10-CM | POA: Diagnosis not present

## 2017-04-01 DIAGNOSIS — N528 Other male erectile dysfunction: Secondary | ICD-10-CM | POA: Diagnosis not present

## 2017-04-01 DIAGNOSIS — Z113 Encounter for screening for infections with a predominantly sexual mode of transmission: Secondary | ICD-10-CM

## 2017-04-01 NOTE — Progress Notes (Signed)
Chief Complaint  Patient presents with  . Annual Exam    Subjective:  Chris Williams is a 46 y.o. male here for a health maintenance visit.  Patient is new pt  Patient Active Problem List   Diagnosis Date Noted  . Hypokalemia 05/17/2016  . Acute renal failure (ARF) (HCC) 05/17/2016  . Dehydration 05/17/2016  . Rhabdomyolysis 05/17/2016  . Chest pain 05/17/2016  . Hypercalcemia 05/17/2016    Past Medical History:  Diagnosis Date  . Kidney stone     Past Surgical History:  Procedure Laterality Date  . CYSTOSCOPY/RETROGRADE/URETEROSCOPY/STONE EXTRACTION WITH BASKET Right 09/29/2012   Procedure: CYSTOSCOPY/RETROGRADE/URETEROSCOPY/STONE EXTRACTION WITH BASKET;  Surgeon: Anner Crete, MD;  Location: [facility];  Service: Urology;  Laterality: Right;  . HOLMIUM LASER APPLICATION Right 09/29/2012   Procedure: HOLMIUM LASER APPLICATION;  Surgeon: Anner Crete, MD;  Location: [facility];  Service: Urology;  Laterality: Right;     Outpatient Medications Prior to Visit  Medication Sig Dispense Refill  . acetaminophen (TYLENOL) 325 MG tablet Take 2 tablets (650 mg total) by mouth every 6 (six) hours as needed for mild pain (or Fever >/= 101).     No facility-administered medications prior to visit.     No Known Allergies   Family History  Problem Relation Age of Onset  . Hypertension Father   . CAD Other   . Deep vein thrombosis Other   . Coronary artery disease Other   . Hypertension Mother      Health Habits: Dental Exam: up to date Eye Exam: up to date Exercise:0 times/week on average Current exercise activities: walking/running   Social History   Social History  . Marital status: Single    Spouse name: N/A  . Number of children: N/A  . Years of education: N/A   Occupational History  . Not on file.   Social History Main Topics  . Smoking status: Never Smoker  . Smokeless tobacco: Never Used  . Alcohol use No  . Drug use: No  . Sexual activity: Not on file   Other  Topics Concern  . Not on file   Social History Narrative  . No narrative on file   History  Alcohol Use No   History  Smoking Status  . Never Smoker  Smokeless Tobacco  . Never Used   History  Drug Use No      Health Maintenance: See under health Maintenance activity for review of completion dates as well.  There is no immunization history on file for this patient.    Depression Screen-PHQ2/9 Depression screen [facility] 2/9 04/01/2017  Decreased Interest 0  Down, Depressed, Hopeless 0  PHQ - 2 Score 0       Depression Severity and Treatment Recommendations:  0-4= None  5-9= Mild / Treatment: Support, educate to call if worse; return in one month  10-14= Moderate / Treatment: Support, watchful waiting; Antidepressant or Psycotherapy  15-19= Moderately severe / Treatment: Antidepressant OR Psychotherapy  >= 20 = Major depression, severe / Antidepressant AND Psychotherapy    Review of Systems   ROS  See HPI for ROS as well.  Review of Systems See HPI Constitution: No fevers or chills No malaise No diaphoresis Skin: No rash or itching Eyes: no blurry vision, no double vision GU: no dysuria or hematuria Neuro: no dizziness or headaches   Objective:   Vitals:   04/01/17 1412  BP: 134/82  Pulse: (!) 110  Resp: 17  Temp: 98.3 F (36.8 C)  TempSrc: Oral  SpO2: 97%  Weight: 250 lb 3.2 oz (113.5 kg)  Height: 6' 0.28" (1.836 m)    Body mass index is 33.67 kg/m.  Physical Exam  BP 134/82 (BP Location: Left Arm, Patient Position: Sitting, Cuff Size: Large)   Pulse 95   Temp 98.3 F (36.8 C) (Oral)   Resp 17   Ht 6' 0.28" (1.836 m)   Wt 250 lb 3.2 oz (113.5 kg)   SpO2 96%   BMI 33.67 kg/m   General Appearance:    Alert, cooperative, no distress, appears stated age  Head:    Normocephalic, without obvious abnormality, atraumatic  Eyes:    PERRL, conjunctiva/corneas clear, EOM's intact, fundi    benign, both eyes       Ears:    Normal TM's and  external ear canals, both ears  Nose:   Nares normal, septum midline, mucosa normal, no drainage   or sinus tenderness  Throat:   Lips, mucosa, and tongue normal; teeth and gums normal  Neck:   Supple, symmetrical, trachea midline, no adenopathy;       thyroid:  No enlargement/tenderness/nodules; no carotid   bruit or JVD  Back:     Symmetric, no curvature, ROM normal, no CVA tenderness  Lungs:     Clear to auscultation bilaterally, respirations unlabored  Chest wall:    No tenderness or deformity  Heart:    Regular rate and rhythm, S1 and S2 normal, no murmur, rub   or gallop  Abdomen:     Soft, non-tender, bowel sounds active all four quadrants,    no masses, no organomegaly  Rectal:    Chaperone present: Normal tone, normal prostate, no masses or tenderness;   guaiac negative stool  Extremities:   Extremities normal, atraumatic, no cyanosis or edema  Pulses:   2+ and symmetric all extremities  Skin:   Skin color, texture, turgor normal, no rashes or lesions  Lymph nodes:   Cervical, supraclavicular, and axillary nodes normal  Neurologic:   CNII-XII intact. Normal strength, sensation and reflexes      throughout      Assessment/Plan:   Patient was seen for a health maintenance exam.  Counseled the patient on health maintenance issues. Reviewed her health mainteance schedule and ordered appropriate tests (see orders.) Counseled on regular exercise and weight management. Recommend regular eye exams and dental cleaning.   The following issues were addressed today for health maintenance:   Chris Williams was seen today for annual exam.  Diagnoses and all orders for this visit:  Health maintenance examination- age appropriate screenings reviewed  Other male erectile dysfunction- will check for hormonal changes -     Hemoglobin A1c -     TestT+TestF+SHBG  Chronic fatigue -     Comprehensive metabolic panel -     CBC -     TSH -     TestT+TestF+SHBG -     Vitamin B12 -     VITAMIN  D 25 Hydroxy (Vit-D Deficiency, Fractures) -     POCT urinalysis dipstick  Muscle cramps -     Comprehensive metabolic panel  Numbness and tingling of right arm- will check for underlying causes -     CBC -     Vitamin B12  Screening for prostate cancer- discussed options -     PSA  Screen for STD (sexually transmitted disease)- consent given -     HIV antibody -     RPR -  Hepatitis B surface antigen -     GC/Chlamydia Probe Amp  Screening, lipid -     Lipid panel    No Follow-up on file.    Body mass index is 33.67 kg/m.:  Discussed the patient's BMI with patient. The BMI body mass index is 33.67 kg/m.     No future appointments.  Patient Instructions       IF you received an x-ray today, you will receive an invoice from [facility]. Please contact [facility] at [phone] with questions or concerns regarding your invoice.   IF you received labwork today, you will receive an invoice from [facility]. Please contact [facility] at [phone] with questions or concerns regarding your invoice.   Our billing staff will not be able to assist you with questions regarding bills from these companies.  You will be contacted with the lab results as soon as they are available. The fastest way to get your results is to activate your My Chart account. Instructions are located on the last page of this paperwork. If you have not heard from [place] regarding the results in 2 weeks, please contact this office.

## 2017-04-01 NOTE — Patient Instructions (Signed)
     IF you received an x-ray today, you will receive an invoice from [facility]. Please contact [facility] at [phone] with questions or concerns regarding your invoice.   IF you received labwork today, you will receive an invoice from [facility]. Please contact [facility] at [phone] with questions or concerns regarding your invoice.   Our billing staff will not be able to assist you with questions regarding bills from these companies.  You will be contacted with the lab results as soon as they are available. The fastest way to get your results is to activate your My Chart account. Instructions are located on the last page of this paperwork. If you have not heard from us regarding the results in 2 weeks, please contact this office.     

## 2017-04-02 LAB — GC/CHLAMYDIA PROBE AMP
CHLAMYDIA, DNA PROBE: NEGATIVE
NEISSERIA GONORRHOEAE BY PCR: NEGATIVE

## 2017-04-04 LAB — PSA: Prostate Specific Ag, Serum: 1.3 ng/mL (ref 0.0–4.0)

## 2017-04-04 LAB — COMPREHENSIVE METABOLIC PANEL
ALT: 27 IU/L (ref 0–44)
AST: 22 IU/L (ref 0–40)
Albumin/Globulin Ratio: 1.5 (ref 1.2–2.2)
Albumin: 4.8 g/dL (ref 3.5–5.5)
Alkaline Phosphatase: 67 IU/L (ref 39–117)
BUN / CREAT RATIO: 7 — AB (ref 9–20)
BUN: 13 mg/dL (ref 6–24)
Bilirubin Total: 0.5 mg/dL (ref 0.0–1.2)
CO2: 26 mmol/L (ref 20–29)
CREATININE: 1.76 mg/dL — AB (ref 0.76–1.27)
Calcium: 10.1 mg/dL (ref 8.7–10.2)
Chloride: 101 mmol/L (ref 96–106)
GFR calc non Af Amer: 46 mL/min/{1.73_m2} — ABNORMAL LOW (ref >59)
GFR, EST AFRICAN AMERICAN: 53 mL/min/{1.73_m2} — AB (ref >59)
GLOBULIN, TOTAL: 3.1 g/dL (ref 1.5–4.5)
Glucose: 92 mg/dL (ref 65–99)
Potassium: 3.8 mmol/L (ref 3.5–5.2)
Sodium: 143 mmol/L (ref 134–144)
Total Protein: 7.9 g/dL (ref 6.0–8.5)

## 2017-04-04 LAB — CBC
HEMATOCRIT: 48.5 % (ref 37.5–51.0)
HEMOGLOBIN: 16.5 g/dL (ref 13.0–17.7)
MCH: 29.8 pg (ref 26.6–33.0)
MCHC: 34 g/dL (ref 31.5–35.7)
MCV: 88 fL (ref 79–97)
Platelets: 193 10*3/uL (ref 150–379)
RBC: 5.53 x10E6/uL (ref 4.14–5.80)
RDW: 14.4 % (ref 12.3–15.4)
WBC: 5.8 10*3/uL (ref 3.4–10.8)

## 2017-04-04 LAB — LIPID PANEL
CHOL/HDL RATIO: 5.1 ratio — AB (ref 0.0–5.0)
Cholesterol, Total: 205 mg/dL — ABNORMAL HIGH (ref 100–199)
HDL: 40 mg/dL (ref >39)
LDL CALC: 117 mg/dL — AB (ref 0–99)
TRIGLYCERIDES: 242 mg/dL — AB (ref 0–149)
VLDL Cholesterol Cal: 48 mg/dL — ABNORMAL HIGH (ref 5–40)

## 2017-04-04 LAB — HEMOGLOBIN A1C
Est. average glucose Bld gHb Est-mCnc: 114 mg/dL
Hgb A1c MFr Bld: 5.6 % (ref 4.8–5.6)

## 2017-04-04 LAB — VITAMIN B12: VITAMIN B 12: 734 pg/mL (ref 232–1245)

## 2017-04-04 LAB — TESTT+TESTF+SHBG
SEX HORMONE BINDING: 23.5 nmol/L (ref 16.5–55.9)
TESTOSTERONE, TOTAL: 273.7 ng/dL (ref 264.0–916.0)
Testosterone, Free: 6.5 pg/mL — ABNORMAL LOW (ref 6.8–21.5)

## 2017-04-04 LAB — RPR: RPR Ser Ql: NONREACTIVE

## 2017-04-04 LAB — HIV ANTIBODY (ROUTINE TESTING W REFLEX): HIV SCREEN 4TH GENERATION: NONREACTIVE

## 2017-04-04 LAB — VITAMIN D 25 HYDROXY (VIT D DEFICIENCY, FRACTURES): VIT D 25 HYDROXY: 20.6 ng/mL — AB (ref 30.0–100.0)

## 2017-04-04 LAB — TSH: TSH: 1.24 u[IU]/mL (ref 0.450–4.500)

## 2017-04-04 LAB — HEPATITIS B SURFACE ANTIGEN: Hepatitis B Surface Ag: NEGATIVE

## 2017-10-05 DIAGNOSIS — M25552 Pain in left hip: Secondary | ICD-10-CM | POA: Insufficient documentation

## 2018-01-21 ENCOUNTER — Emergency Department (HOSPITAL_COMMUNITY): Payer: Self-pay

## 2018-01-21 ENCOUNTER — Encounter: Payer: Self-pay | Admitting: Physician Assistant

## 2018-01-21 ENCOUNTER — Other Ambulatory Visit: Payer: Self-pay

## 2018-01-21 ENCOUNTER — Ambulatory Visit (INDEPENDENT_AMBULATORY_CARE_PROVIDER_SITE_OTHER): Payer: PRIVATE HEALTH INSURANCE

## 2018-01-21 ENCOUNTER — Encounter (HOSPITAL_COMMUNITY): Payer: Self-pay | Admitting: Family Medicine

## 2018-01-21 ENCOUNTER — Ambulatory Visit: Payer: PRIVATE HEALTH INSURANCE | Admitting: Physician Assistant

## 2018-01-21 VITALS — BP 134/86 | HR 102 | Temp 97.9°F | Resp 18 | Ht 72.28 in | Wt 251.2 lb

## 2018-01-21 DIAGNOSIS — Z6833 Body mass index (BMI) 33.0-33.9, adult: Secondary | ICD-10-CM | POA: Diagnosis not present

## 2018-01-21 DIAGNOSIS — R0602 Shortness of breath: Secondary | ICD-10-CM | POA: Insufficient documentation

## 2018-01-21 DIAGNOSIS — E669 Obesity, unspecified: Secondary | ICD-10-CM

## 2018-01-21 DIAGNOSIS — R079 Chest pain, unspecified: Secondary | ICD-10-CM | POA: Insufficient documentation

## 2018-01-21 DIAGNOSIS — Z79899 Other long term (current) drug therapy: Secondary | ICD-10-CM | POA: Insufficient documentation

## 2018-01-21 DIAGNOSIS — N200 Calculus of kidney: Secondary | ICD-10-CM | POA: Insufficient documentation

## 2018-01-21 LAB — I-STAT TROPONIN, ED: TROPONIN I, POC: 0 ng/mL (ref 0.00–0.08)

## 2018-01-21 LAB — CBC
HCT: 49.2 % (ref 39.0–52.0)
HEMOGLOBIN: 16.3 g/dL (ref 13.0–17.0)
MCH: 30.3 pg (ref 26.0–34.0)
MCHC: 33.1 g/dL (ref 30.0–36.0)
MCV: 91.4 fL (ref 78.0–100.0)
Platelets: 190 10*3/uL (ref 150–400)
RBC: 5.38 MIL/uL (ref 4.22–5.81)
RDW: 13.3 % (ref 11.5–15.5)
WBC: 7 10*3/uL (ref 4.0–10.5)

## 2018-01-21 LAB — BASIC METABOLIC PANEL
ANION GAP: 8 (ref 5–15)
BUN: 10 mg/dL (ref 6–20)
CALCIUM: 9.6 mg/dL (ref 8.9–10.3)
CHLORIDE: 109 mmol/L (ref 98–111)
CO2: 29 mmol/L (ref 22–32)
Creatinine, Ser: 1.45 mg/dL — ABNORMAL HIGH (ref 0.61–1.24)
GFR calc Af Amer: >60 mL/min (ref >60)
GFR calc non Af Amer: 57 mL/min — ABNORMAL LOW (ref >60)
GLUCOSE: 102 mg/dL — AB (ref 70–99)
Potassium: 4 mmol/L (ref 3.5–5.1)
Sodium: 146 mmol/L — ABNORMAL HIGH (ref 135–145)

## 2018-01-21 NOTE — Progress Notes (Signed)
Chris Williams Uzelac  MRN: [id number] DOB: 10/21/1970  Subjective:  Chris Williams Fullman is a 47 y.o. male seen in office today for a chief complaint of shortness of breath x 2 days. Feels like he cannot get a good, deep breath. "Like I am sitting in a hot car."  Started acutely last night. Has associated chest tightness. Symptoms have been unchanged since that time. Pt denies claudication, exertional chest pain, fatigue, irregular heart beat, lower extremity edema, near-syncope, orthopnea, palpitations, paroxysmal nocturnal dyspnea, syncope and tachypnea. Aggravating factors are: none. Alleviating factors are: none. Patient's cardiac risk factors are: male gender, obesity (BMI >= 30 kg/m2) and sedentary lifestyle.  Pt denies calf pain, history of deep venous thrombosis, history of pulmonary embolus, history of malignancy, long duration of automobile or plane travel, prolonged stay in bed, recent limb injury or fracture, recent surgery and varicose veins. Previous cardiac testing: electrocardiogram (ECG) and echo in 2017.  Review of Systems  Constitutional: Negative for chills, diaphoresis, fatigue and fever.  Respiratory: Negative for cough and stridor.   Gastrointestinal: Negative for abdominal pain, nausea and vomiting.  Neurological: Negative for dizziness and light-headedness.  Psychiatric/Behavioral: The patient is not nervous/anxious.     Patient Active Problem List   Diagnosis Date Noted  . Nephrolithiasis 01/21/2018  . Pain of left hip joint 10/05/2017  . Hypokalemia 05/17/2016  . Acute renal failure (ARF) (HCC) 05/17/2016  . Dehydration 05/17/2016  . Rhabdomyolysis 05/17/2016  . Chest pain 05/17/2016  . Hypercalcemia 05/17/2016    No current outpatient medications on file prior to visit.   No current facility-administered medications on file prior to visit.     No Known Allergies    Social History   Socioeconomic History  . Marital status: Single    Spouse name: Not on file  . Number  of children: Not on file  . Years of education: Not on file  . Highest education level: Not on file  Occupational History  . Not on file  Social Needs  . Financial resource strain: Not on file  . Food insecurity:    Worry: Not on file    Inability: Not on file  . Transportation needs:    Medical: Not on file    Non-medical: Not on file  Tobacco Use  . Smoking status: Never Smoker  . Smokeless tobacco: Never Used  Substance and Sexual Activity  . Alcohol use: No  . Drug use: No  . Sexual activity: Not on file  Lifestyle  . Physical activity:    Days per week: Not on file    Minutes per session: Not on file  . Stress: Not on file  Relationships  . Social connections:    Talks on phone: Not on file    Gets together: Not on file    Attends religious service: Not on file    Active member of club or organization: Not on file    Attends meetings of clubs or organizations: Not on file    Relationship status: Not on file  . Intimate partner violence:    Fear of current or ex partner: Not on file    Emotionally abused: Not on file    Physically abused: Not on file    Forced sexual activity: Not on file  Other Topics Concern  . Not on file  Social History Narrative  . Not on file    Objective:   BP 134/86   Pulse (!) 102   Temp 97.9 F (36.6 C) (Oral)  Resp 18   Ht 6' 0.28" (1.836 m)   Wt 251 lb 3.2 oz (113.9 kg)   SpO2 96%   BMI 33.81 kg/m    Physical Exam  Constitutional: He is oriented to person, place, and time and well-developed, well-nourished, and in no distress.  HENT:  Head: Normocephalic and atraumatic.  Eyes: Conjunctivae are normal.  Neck: Normal range of motion.  Cardiovascular: Normal rate, regular rhythm, normal heart sounds and intact distal pulses.  Pulmonary/Chest: Effort normal and breath sounds normal. No accessory muscle usage. No respiratory distress. He has no decreased breath sounds. He has no wheezes. He has no rales. He exhibits no  tenderness.  Musculoskeletal:       Right lower leg: He exhibits no edema.       Left lower leg: He exhibits no edema.  Neurological: He is alert and oriented to person, place, and time. Gait normal.  Skin: Skin is warm and dry. He is not diaphoretic.  Psychiatric: Affect normal.  Vitals reviewed.   BP Readings from Last 3 Encounters:  01/21/18 134/86  04/01/17 134/82  05/19/16 136/87   Pulse Readings from Last 3 Encounters:  01/21/18 (!) 102  04/01/17 95  05/19/16 69    Dg Chest 2 View  Result Date: 01/21/2018 CLINICAL DATA:  Shortness of breath and chest tightness. EXAM: CHEST - 2 VIEW COMPARISON:  05/16/2016 FINDINGS: The heart size and mediastinal contours are within normal limits. There is no evidence of pulmonary edema, consolidation, pneumothorax, nodule or pleural fluid. The visualized skeletal structures are unremarkable. There is a stable mild rightward convex thoracic scoliosis. IMPRESSION: No active cardiopulmonary disease. Electronically Signed   By: Irish Lack M.D.   On: 01/21/2018 17:56    EKG shows NSR with rate of 83bpm. PR and QRS intervals within normal limits. No acute changes noted from prior EKG of 05/2016. Findings presented and discussed with Dr. Alvy Bimler.   SpO2 while ambulating ins 93%, pulse is 116bpm.    Assessment and Plan :  This case was precepted with Dr. Alvy Bimler. Pt presenting with SOB at rest, not worsened with activity. Vitals initially showed mild tachycardia, which resolved on EKG. SpO2 96%. Pt is overall well appearing, NAD.  Lungs CTAB. EKG normal. CXR normal. SpO2 while ambulating 93%, HR 116bpm. Cannot rule out PE at this time. Less likely ACS due to age, sx, and EKG findings. Risk factors for PE include obesity. Rec further evaluation from ED. Pt would like to go via personal vehicle as opposed to EMS.  His vitals are stable and he is overall well appearing. Informed him that it would be safest to go via EMS but since he is overall  stable he can go via personal transport. Discussed potential adverse effects of going via personal transport vs with EMS. Pt voices his understanding and agrees to go to ED right away.   1. Shortness of breath - EKG 12-Lead - DG Chest 2 View; Future - Check Pulse Oximetry while ambulating  2. Class 1 obesity without serious comorbidity with body mass index (BMI) of 33.0 to 33.9 in adult, unspecified obesity type    Benjiman Core PA-C  Primary Care at [facility] 01/21/2018 5:59 PM

## 2018-01-21 NOTE — Patient Instructions (Addendum)
Please go to [facility] ED for further evaluation.  Address: [address], [place], [place] [address]     IF you received an x-ray today, you will receive an invoice from [facility]. Please contact [facility] at [phone] with questions or concerns regarding your invoice.   IF you received labwork today, you will receive an invoice from [facility]. Please contact [facility] at [phone] with questions or concerns regarding your invoice.   Our billing staff will not be able to assist you with questions regarding bills from these companies.  You will be contacted with the lab results as soon as they are available. The fastest way to get your results is to activate your My Chart account. Instructions are located on the last page of this paperwork. If you have not heard from us regarding the results in 2 weeks, please contact this office.

## 2018-01-21 NOTE — ED Triage Notes (Signed)
Patient was seen earlier today by Benjiman CoreBrittany Wiseman, PA-C with [facility] at [facility] and referred to [facility] for further evaluation for his shortness of breath and chest pain. Patient reports he started experiencing a stuffy nose. Took Clartin and it seemed to clear his sinus/eyes. This morning around 4:30am developed sudden onset of shortness of breath. He reports he took a puff of his girlfriends inhaler. It seemed to help a little. However, over the day it has got worse.

## 2018-01-22 ENCOUNTER — Other Ambulatory Visit: Payer: Self-pay

## 2018-01-22 ENCOUNTER — Emergency Department (HOSPITAL_COMMUNITY)
Admission: EM | Admit: 2018-01-22 | Discharge: 2018-01-22 | Disposition: A | Discharge status: Home / Self Care | Payer: Self-pay | Attending: Emergency Medicine | Admitting: Emergency Medicine

## 2018-01-22 DIAGNOSIS — R079 Chest pain, unspecified: Secondary | ICD-10-CM

## 2018-01-22 DIAGNOSIS — R0602 Shortness of breath: Secondary | ICD-10-CM

## 2018-01-22 LAB — I-STAT TROPONIN, ED: Troponin i, poc: 0 ng/mL (ref 0.00–0.08)

## 2018-01-22 LAB — D-DIMER, QUANTITATIVE: D-Dimer, Quant: <0.27 ug/mL-FEU (ref 0.00–0.50)

## 2018-01-22 MED ORDER — DEXAMETHASONE SODIUM PHOSPHATE 10 MG/ML IJ SOLN
10.0000 mg | Freq: Once | INTRAMUSCULAR | Status: AC
  Administered 2018-01-22: 10 mg via INTRAVENOUS
  Filled 2018-01-22: qty 1

## 2018-01-22 MED ORDER — GI COCKTAIL ~~LOC~~
30.0000 mL | Freq: Once | ORAL | Status: AC
  Administered 2018-01-22: 30 mL via ORAL
  Filled 2018-01-22: qty 30

## 2018-01-22 MED ORDER — ALBUTEROL SULFATE HFA 108 (90 BASE) MCG/ACT IN AERS: 1.0000 | INHALATION_SPRAY | Freq: Four times a day (QID) | RESPIRATORY_TRACT | 0 refills | Status: DC | PRN

## 2018-01-22 MED ORDER — IPRATROPIUM-ALBUTEROL 0.5-2.5 (3) MG/3ML IN SOLN
3.0000 mL | Freq: Once | RESPIRATORY_TRACT | Status: AC
  Administered 2018-01-22: 3 mL via RESPIRATORY_TRACT
  Filled 2018-01-22: qty 3

## 2018-01-22 MED ORDER — SODIUM CHLORIDE 0.9 % IV BOLUS
1000.0000 mL | Freq: Once | INTRAVENOUS | Status: AC
  Administered 2018-01-22: 1000 mL via INTRAVENOUS

## 2018-01-22 NOTE — Discharge Instructions (Addendum)
Your work-up has been reassuring in the emergency department today.  Unknown cause of your symptoms.  Use the inhaler as needed for shortness of breath.  Please follow-up with your primary care doctor this week.  You may need pulmonary functions test to test your lungs.  Return to the ED if you develop worsening shortness of breath or for any other reason.

## 2018-01-22 NOTE — ED Provider Notes (Signed)
[facility]-EMERGENCY DEPT Provider Note   CSN: [id number] Arrival date & time: 01/21/18  1848     History   Chief Complaint Chief Complaint  Patient presents with  . Shortness of Breath    HPI Chris Williams is a 47 y.o. male.  HPI 47 year old AA male with no pertinent past medical history presents to the emergency department today for evaluation of shortness of breath and chest pain.  Patient reports that earlier this morning at approximately 4:30 AM he developed sudden onset of shortness of breath and right-sided chest pain.  States the chest pain radiated to the back.  States that it was difficult to take a deep breath.  He describes it as "getting into a hot car and cannot get enough air in".  Patient used his girlfriend's albuterol inhaler and had some relief of his symptoms.  Patient states that however throughout the day his symptoms return.  He went to his primary care doctor for this and they referred him to the ED for possible blood clot.  Patient denies any history of DVT/PE, prolonged immobilization, recent hospitalization/surgeries, unilateral leg swelling or calf tenderness, hemoptysis.  Patient denies cardiac history but does report significant family cardiac history.  Patient reports some mild URI symptoms.  Denies any specific cough or associated fever.  Exertion seems to make the shortness of breath worse.  Nothing makes better.  Pt denies any fever, chill, ha, vision changes, lightheadedness, dizziness, congestion, neck pain, cough, abd pain, n/v/d, urinary symptoms, change in bowel habits, melena, hematochezia, lower extremity paresthesias.  Past Medical History:  Diagnosis Date  . Kidney stone     Patient Active Problem List   Diagnosis Date Noted  . Nephrolithiasis 01/21/2018  . Pain of left hip joint 10/05/2017  . Hypokalemia 05/17/2016  . Acute renal failure (ARF) (HCC) 05/17/2016  . Dehydration 05/17/2016  . Rhabdomyolysis 05/17/2016  .  Chest pain 05/17/2016  . Hypercalcemia 05/17/2016    Past Surgical History:  Procedure Laterality Date  . CYSTOSCOPY/RETROGRADE/URETEROSCOPY/STONE EXTRACTION WITH BASKET Right 09/29/2012   Procedure: CYSTOSCOPY/RETROGRADE/URETEROSCOPY/STONE EXTRACTION WITH BASKET;  Surgeon: Anner CreteJohn J Wrenn, MD;  Location: [facility];  Service: Urology;  Laterality: Right;  . HOLMIUM LASER APPLICATION Right 09/29/2012   Procedure: HOLMIUM LASER APPLICATION;  Surgeon: Anner CreteJohn J Wrenn, MD;  Location: [facility];  Service: Urology;  Laterality: Right;        Home Medications    Prior to Admission medications   Medication Sig Start Date End Date Taking? Authorizing Provider  loratadine (CLARITIN) 10 MG tablet Take 10 mg by mouth once.   Yes [provider]  Multiple Vitamin (MULTIVITAMIN WITH MINERALS) TABS tablet Take 1 tablet by mouth daily.   Yes [provider]  albuterol (PROVENTIL HFA;VENTOLIN HFA) 108 (90 Base) MCG/ACT inhaler Inhale 1-2 puffs into the lungs every 6 (six) hours as needed for wheezing or shortness of breath. 01/22/18   Rise MuLeaphart, Minh Roanhorse T, PA-C    Family History Family History  Problem Relation Age of Onset  . Hypertension Father   . CAD Other   . Deep vein thrombosis Other   . Coronary artery disease Other   . Hypertension Mother     Social History Social History   Tobacco Use  . Smoking status: Never Smoker  . Smokeless tobacco: Never Used  Substance Use Topics  . Alcohol use: No  . Drug use: No     Allergies   Patient has no known allergies.   Review of Systems  Review of Systems  All other systems reviewed and are negative.    Physical Exam Updated Vital Signs BP 125/86   Pulse 79   Temp 98 F (36.7 C) (Oral)   Resp 17   Ht 6\' 1"  (1.854 m)   Wt 113.4 kg (250 lb)   SpO2 99%   BMI 32.98 kg/m   Physical Exam  Constitutional: He is oriented to person, place, and time. He appears well-developed and well-nourished.  Non-toxic appearance. No  distress.  HENT:  Head: Normocephalic and atraumatic.  Nose: Nose normal.  Mouth/Throat: Oropharynx is clear and moist.  Eyes: Pupils are equal, round, and reactive to light. Conjunctivae are normal. Right eye exhibits no discharge. Left eye exhibits no discharge.  Neck: Normal range of motion. Neck supple. No JVD present. No tracheal deviation present.  Cardiovascular: Normal rate, regular rhythm, normal heart sounds and intact distal pulses.  Pulmonary/Chest: Effort normal. No tachypnea. No respiratory distress. He has decreased breath sounds. He has no wheezes. He has no rhonchi. He has no rales. He exhibits no tenderness.  No hypoxia or tachypnea.  Abdominal: Soft. Bowel sounds are normal. He exhibits no distension. There is no tenderness. There is no rebound and no guarding.  Musculoskeletal: Normal range of motion.  No lower extremity edema or calf tenderness.  Lymphadenopathy:    He has no cervical adenopathy.  Neurological: He is alert and oriented to person, place, and time.  Skin: Skin is warm and dry. Capillary refill takes less than 2 seconds. He is not diaphoretic.  Psychiatric: His behavior is normal. Judgment and thought content normal.  Nursing note and vitals reviewed.    ED Treatments / Results  Labs (all labs ordered are listed, but only abnormal results are displayed) Labs Reviewed  BASIC METABOLIC PANEL - Abnormal; Notable for the following components:      Result Value   Sodium 146 (*)    Glucose, Bld 102 (*)    Creatinine, Ser 1.45 (*)    GFR calc non Af Amer 57 (*)    All other components within normal limits  CBC  D-DIMER, QUANTITATIVE (NOT AT [facility])  I-STAT TROPONIN, ED  I-STAT TROPONIN, ED    EKG EKG Interpretation  Date/Time:  Tuesday January 21 2018 19:38:38 EDT Ventricular Rate:  80 PR Interval:  136 QRS Duration: 84 QT Interval:  366 QTC Calculation: 422 R Axis:   9 Text Interpretation:  Normal sinus rhythm Normal ECG No significant change  since last tracing Confirmed by Drema Pry [phone]) on 01/22/2018 1:31:26 AM   Radiology Dg Chest 2 View  Result Date: 01/21/2018 CLINICAL DATA:  Shortness of breath for 2 days.  Chest pain. EXAM: CHEST - 2 VIEW COMPARISON:  01/21/2018 FINDINGS: The cardiomediastinal silhouette is within normal limits. No confluent airspace opacity, edema, pleural effusion, or pneumothorax is identified. There is minimal basilar scarring. No acute osseous abnormality is seen. IMPRESSION: No active cardiopulmonary disease. Electronically Signed   By: Sebastian Ache M.D.   On: 01/21/2018 20:37   Dg Chest 2 View  Result Date: 01/21/2018 CLINICAL DATA:  Shortness of breath and chest tightness. EXAM: CHEST - 2 VIEW COMPARISON:  05/16/2016 FINDINGS: The heart size and mediastinal contours are within normal limits. There is no evidence of pulmonary edema, consolidation, pneumothorax, nodule or pleural fluid. The visualized skeletal structures are unremarkable. There is a stable mild rightward convex thoracic scoliosis. IMPRESSION: No active cardiopulmonary disease. Electronically Signed   By: Rudene Anda.D.  On: 01/21/2018 17:56    Procedures Procedures (including critical care time)  Medications Ordered in ED Medications  sodium chloride 0.9 % bolus 1,000 mL (0 mLs Intravenous Stopped 01/22/18 0209)  gi cocktail (Maalox,Lidocaine,Donnatal) (30 mLs Oral Given 01/22/18 0121)  ipratropium-albuterol (DUONEB) 0.5-2.5 (3) MG/3ML nebulizer solution 3 mL (3 mLs Nebulization Given 01/22/18 0122)  dexamethasone (DECADRON) injection 10 mg (10 mg Intravenous Given 01/22/18 0122)     Initial Impression / Assessment and Plan / ED Course  I have reviewed the triage vital signs and the nursing notes.  Pertinent labs & imaging results that were available during my care of the patient were reviewed by me and considered in my medical decision making (see chart for details).     Pt presents to the Ed today with complaints  of cp. Patient is to be discharged with recommendation to follow up with PCP in regards to today's hospital visit. Chest pain is not likely of cardiac or pulmonary etiology d/t presentation, d-dimer negative, VSS, no tracheal deviation, no JVD or new murmur, RRR, breath sounds equal bilaterally, EKG without any change from prior tracing and shows no signs of ischemia, negative delta troponin, and negative CXR.  Labs reassuring.  Mild elevation in creatinine however this appears to be similar to patient's baseline.  Patient given steroids, albuterol inhaler and GI cocktail.  Complete resolution of symptoms.  Unknown etiology of patient's symptoms.  May be related to underlying lung disease.  Patient given albuterol inhaler.  I ambulated patient in the ED myself and saturations stated 100% on room air.  Encouraged follow-up with PCP for possible pulmonary function test.  Pt has been advised to return to the ED is CP becomes exertional, associated with diaphoresis or nausea, radiates to left jaw/arm, worsens or becomes concerning in any way.   Pt is hemodynamically stable, in NAD, & able to ambulate in the ED. Evaluation does not show pathology that would require ongoing emergent intervention or inpatient treatment. I explained the diagnosis to the patient. Pain has been managed & has no complaints prior to dc. Pt is comfortable with above plan and is stable for discharge at this time. All questions were answered prior to disposition. Strict return precautions for f/u to the ED were discussed. Encouraged follow up with PCP.     Final Clinical Impressions(s) / ED Diagnoses   Final diagnoses:  SOB (shortness of breath)  Chest pain, unspecified type    ED Discharge Orders        Ordered    albuterol (PROVENTIL HFA;VENTOLIN HFA) 108 (90 Base) MCG/ACT inhaler  Every 6 hours PRN     01/22/18 0409       Rise Mu, PA-C 01/22/18 0754    Nira Conn, MD 01/22/18 [phone]

## 2018-01-22 NOTE — ED Notes (Signed)
Writer ambulated pt, Pt O2 sats dropped to 90%, RN notified.

## 2018-01-27 ENCOUNTER — Ambulatory Visit: Payer: PRIVATE HEALTH INSURANCE | Admitting: Physician Assistant

## 2018-01-27 ENCOUNTER — Encounter: Payer: Self-pay | Admitting: Physician Assistant

## 2018-01-27 ENCOUNTER — Other Ambulatory Visit: Payer: Self-pay

## 2018-01-27 VITALS — BP 117/82 | HR 88 | Temp 99.0°F | Resp 18 | Ht 72.84 in | Wt 246.6 lb

## 2018-01-27 DIAGNOSIS — F411 Generalized anxiety disorder: Secondary | ICD-10-CM

## 2018-01-27 DIAGNOSIS — R0602 Shortness of breath: Secondary | ICD-10-CM | POA: Diagnosis not present

## 2018-01-27 MED ORDER — ALBUTEROL SULFATE (2.5 MG/3ML) 0.083% IN NEBU
2.5000 mg | INHALATION_SOLUTION | Freq: Four times a day (QID) | RESPIRATORY_TRACT | 0 refills | Status: AC | PRN
Start: 2018-01-27 — End: ?

## 2018-01-27 MED ORDER — HYDROXYZINE HCL 25 MG PO TABS: 12.5000 mg | ORAL_TABLET | Freq: Three times a day (TID) | ORAL | 0 refills | Status: AC | PRN

## 2018-01-27 MED ORDER — ALBUTEROL SULFATE HFA 108 (90 BASE) MCG/ACT IN AERS: 2.0000 | INHALATION_SPRAY | RESPIRATORY_TRACT | 1 refills | Status: AC | PRN

## 2018-01-27 NOTE — Progress Notes (Signed)
Zohair Epp  MRN: [id number] DOB: [birth year]  Subjective:  Chris Williams is a 47 y.o. male seen in office today for a chief complaint of ED f/u. Was last seen on 01/22/18 and sent to ED for concern of possible PE. Pt was feeling SOB at rest, not worsened with activity. Vitals initially showed mild tachycardia, which resolved on EKG. SpO2 96%. ED labs showed negative dimer negative, EKG without any change from prior tracing and shows no signs of ischemia, negative delta troponin, and negative CXR.  Labs reassuring.  Mild elevation in creatinine however this appears to be similar to patient's baseline.  Patient was given steroids, albuterol inhaler and GI cocktail with complete resolution of symptoms. Suspected his sx may be related to underlying lung disease. Rec he f/u with PCP for possible PFTs. Today, notes that since ED visit, any time he is exposed to hot air or cold air, gets the sensation of feeling SOB and chest tightness. Has immediate releif with albuterol inhaler. 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 Does not notice that sensation with exercise, animals, or certain scents.  Needs work note.  Has been feeling intermittently anxious for the past couple of months. Nothing has changed just noticed it more. Worries about different things. Notes he gets "worked up" when he starts having these thoughts and it makes his sensation of SOB worse. Denies dysphoric mood, hallucinations, SI, and HI. Pt has PMH of seasonal allergies, takes claratin prn. No PMH of asthma or COPD. Denies smoking. FH of anxiety is cousin.    Review of Systems  Constitutional: Negative for chills and diaphoresis.  Respiratory: Negative for cough.   Cardiovascular: Negative for leg swelling.  Neurological: Negative for dizziness and light-headedness.      Patient Active Problem List   Diagnosis Date Noted  . Nephrolithiasis 01/21/2018  . Pain of left hip  joint 10/05/2017  . Hypokalemia 05/17/2016  . Acute renal failure (ARF) ([place]) 05/17/2016  . Dehydration 05/17/2016  . Rhabdomyolysis 05/17/2016  . Chest pain 05/17/2016  . Hypercalcemia 05/17/2016    Current Outpatient Medications on File Prior to Visit  Medication Sig Dispense Refill  . loratadine (CLARITIN) 10 MG tablet Take 10 mg by mouth once.    . Multiple Vitamin (MULTIVITAMIN WITH MINERALS) TABS tablet Take 1 tablet by mouth daily.     No current facility-administered medications on file prior to visit.     No Known Allergies    Social History   Socioeconomic History  . Marital status: Single    Spouse name: Not on file  . Number of children: 3  . Years of education: Not on file  . Highest education level: Not on file  Occupational History  . Not on file  Social Needs  . Financial resource strain: Not on file  . Food insecurity:    Worry: Not on file    Inability: Not on file  . Transportation needs:    Medical: Not on file    Non-medical: Not on file  Tobacco Use  . Smoking status: Never Smoker  . Smokeless tobacco: Never Used  Substance and Sexual Activity  . Alcohol use: No  . Drug use: No  . Sexual activity: Yes  Lifestyle  . Physical activity:    Days per week: Not on file    Minutes per session: Not on file  . Stress: Not on file  Relationships  .  Social connections:    Talks on phone: Not on file    Gets together: Not on file    Attends religious service: Not on file    Active member of club or organization: Not on file    Attends meetings of clubs or organizations: Not on file    Relationship status: Not on file  . Intimate partner violence:    Fear of current or ex partner: Not on file    Emotionally abused: Not on file    Physically abused: Not on file    Forced sexual activity: Not on file  Other Topics Concern  . Not on file  Social History Narrative  . Not on file    Objective:  BP 117/82 (BP Location: Right Arm, Patient  Position: Sitting, Cuff Size: Large)   Pulse 88   Temp 99 F (37.2 C) (Oral)   Resp 18   Ht 6' 0.84" (1.85 m)   Wt 246 lb 9.6 oz (111.9 kg)   SpO2 98%   BMI 32.68 kg/m   Physical Exam  Constitutional: He is oriented to person, place, and time. He appears well-developed and well-nourished. No distress.  HENT:  Head: Normocephalic and atraumatic.  Mouth/Throat: Uvula is midline, oropharynx is clear and moist and mucous membranes are normal. No tonsillar exudate.  Eyes: Pupils are equal, round, and reactive to light. Conjunctivae and EOM are normal.  Neck: Normal range of motion.  Cardiovascular: Normal rate, regular rhythm, normal heart sounds and intact distal pulses.  Pulmonary/Chest: Effort normal and breath sounds normal. No stridor. No respiratory distress. He has no decreased breath sounds. He has no wheezes. He has no rhonchi. He has no rales.  Musculoskeletal:       Right lower leg: He exhibits no swelling.       Left lower leg: He exhibits no swelling.  Neurological: He is alert and oriented to person, place, and time.  Skin: Skin is warm and dry.  Psychiatric: He has a normal mood and affect.  Vitals reviewed.  GAD 7 : Generalized Anxiety Score 01/27/2018  Nervous, Anxious, on Edge 2  Control/stop worrying 1  Worry too much - different things 2  Trouble relaxing 3  Restless 1  Easily annoyed or irritable 1  Afraid - awful might happen 3  Total GAD 7 Score 13      Assessment and Plan :  1. Anxiety state Unsure if anxiety playing a role in pt's sensation of SOB but could be possible.  3 Labs pending.  GAD score of 13.  Patient does not want any daily medication at this time but would consider using as needed medication for moments of increased anxiety.  Suggest hydroxyzine as needed.  He  may also benefit from therapy.  Given contact info for local therapist. - hydrOXYzine (ATARAX/VISTARIL) 25 MG tablet; Take 0.5-1 tablets (12.5-25 mg total) by mouth every 8 (eight)  hours as needed.  Dispense: 30 tablet; Refill: 0 - CBC with Differential/Platelet - CMP14+EGFR - TSH - Vitamin B12 - Urinalysis, dipstick only  2. Shortness of breath Hx suspicious for irritant induced bronchospasm. Reassuring that he gets relief with albuterol inhaler. His vitals are stable today. Rec he avoid provoking irritants. Work note provided. Rec f/u with pulm for PFTs. Given strict return precautions.  - albuterol (PROVENTIL HFA;VENTOLIN HFA) 108 (90 Base) MCG/ACT inhaler; Inhale 2 puffs into the lungs every 4 (four) hours as needed for wheezing or shortness of breath (cough, shortness of breath or wheezing.).  Dispense: 1 Inhaler; Refill: 1 - albuterol (PROVENTIL) (2.5 MG/3ML) 0.083% nebulizer solution; Take 3 mLs (2.5 mg total) by nebulization every 6 (six) hours as needed for wheezing or shortness of breath.  Dispense: 150 mL; Refill: 0 - Ambulatory referral to Pulmonology  Tenna Delaine PA-C  [facility] 01/27/2018 3:58 PM

## 2018-01-27 NOTE — Patient Instructions (Addendum)
Use albuterol inhaler as needed every 4-6 hours. Try to avoid extremes of temperatures or any other aggravating environments.  If you start to have worsening shortness of breath, chest pain, or wheezing please seek care immediately. I have placed a referral to pulmonology for pulmonary function testing. They should contact you within 1-2 weeks.   For anxiety,  I recommend a short term medication for moments of panic attacks. You may use hydrozyzine as needed.  This medication can make you drowsy so please keep this in mind. Below is some information for local therapists. I recommend you reach out to one of these therapists before our next visit.Plan to return to clinic in 4 weeks for reevaluation of symptoms.   For therapy -- [facility] ([address] [place] [place]) - [phone] [facility] [place]) - [phone] [facility] - [phone] [facility] - [phone] [facility] - [phone] [facility]  - [phone] (do not file insurance)  Bronchospasm, Adult Bronchospasm is when airways in the lungs get smaller. When this happens, it can be hard to breathe. You may cough. You may also make a whistling sound when you breathe (wheeze). Follow these instructions at home: Medicines  Take over-the-counter and prescription medicines only as told by your doctor.  If you need to use an inhaler or nebulizer to take your medicine, ask your doctor how to use it.  If you were given a spacer, always use it with your inhaler. Lifestyle  Change your heating and air conditioning filter. Do this at least once a month.  Try not to use fireplaces and wood stoves.  Do not  smoke. Do not  allow smoking in your home.  Try not to use things that have a strong smell, like perfume.  Get rid of pests (such as roaches and mice) and their poop.  Remove any mold from  your home.  Keep your house clean. Get rid of dust.  Use cleaning products that have no smell.  Replace carpet with wood, tile, or vinyl flooring.  Use allergy-proof pillows, mattress covers, and box spring covers.  Wash bed sheets and blankets every week. Use hot water. Dry them in a dryer.  Use blankets that are made of polyester or cotton.  Wash your hands often.  Keep pets out of your bedroom.  When you exercise, try not to breathe in cold air. General instructions  Have a plan for getting medical care. Know these things: ? When to call your doctor. ? When to call local emergency services (911 in the [place] ? Where to go in an emergency.  Stay up to date on your shots (immunizations).  When you have an episode: ? Stay calm. ? Relax. ? Breathe slowly. Contact a doctor if:  Your muscles ache.  Your chest hurts.  The color of the mucus you cough up (sputum) changes from clear or white to yellow, green, gray, or bloody.  The mucus you cough up gets thicker.  You have a fever. Get help right away if:  The whistling sound gets worse, even after you take your medicines.  Your coughing gets worse.  You find it even harder to breathe.  Your chest hurts very much. Summary  Bronchospasm is when airways in the lungs get smaller.  When this happens, it can be hard to breathe. You may cough. You may also make a whistling sound when you breathe.  Stay away from things  that cause you to have episodes. These include smoke or dust. This information is not intended to replace advice given to you by your health care provider. Make sure you discuss any questions you have with your health care provider. Document Released: 04/22/2009 Document Revised: 06/28/2016 Document Reviewed: 06/28/2016 Elsevier Interactive Patient Education  2017 [employer]  IF you received an x-ray today, you will receive an invoice from [facility]. Please contact [facility]  at [phone] with questions or concerns regarding your invoice.   IF you received labwork today, you will receive an invoice from [facility]. Please contact [facility] at [phone] with questions or concerns regarding your invoice.   Our billing staff will not be able to assist you with questions regarding bills from these companies.  You will be contacted with the lab results as soon as they are available. The fastest way to get your results is to activate your My Chart account. Instructions are located on the last page of this paperwork. If you have not heard from us regarding the results in 2 weeks, please contact this office.

## 2018-01-28 LAB — CMP14+EGFR
ALBUMIN: 4.6 g/dL (ref 3.5–5.5)
ALK PHOS: 68 IU/L (ref 39–117)
ALT: 19 IU/L (ref 0–44)
AST: 14 IU/L (ref 0–40)
Albumin/Globulin Ratio: 1.6 (ref 1.2–2.2)
BUN / CREAT RATIO: 9 (ref 9–20)
BUN: 15 mg/dL (ref 6–24)
Bilirubin Total: 0.4 mg/dL (ref 0.0–1.2)
CO2: 25 mmol/L (ref 20–29)
CREATININE: 1.58 mg/dL — AB (ref 0.76–1.27)
Calcium: 10.2 mg/dL (ref 8.7–10.2)
Chloride: 103 mmol/L (ref 96–106)
GFR calc non Af Amer: 52 mL/min/{1.73_m2} — ABNORMAL LOW (ref >59)
GFR, EST AFRICAN AMERICAN: 60 mL/min/{1.73_m2} (ref >59)
GLUCOSE: 93 mg/dL (ref 65–99)
Globulin, Total: 2.8 g/dL (ref 1.5–4.5)
Potassium: 4.3 mmol/L (ref 3.5–5.2)
Sodium: 144 mmol/L (ref 134–144)
TOTAL PROTEIN: 7.4 g/dL (ref 6.0–8.5)

## 2018-01-28 LAB — CBC WITH DIFFERENTIAL/PLATELET
BASOS ABS: 0 10*3/uL (ref 0.0–0.2)
Basos: 0 %
EOS (ABSOLUTE): 0 10*3/uL (ref 0.0–0.4)
Eos: 1 %
HEMOGLOBIN: 16.2 g/dL (ref 13.0–17.7)
Hematocrit: 50.2 % (ref 37.5–51.0)
Immature Grans (Abs): 0 10*3/uL (ref 0.0–0.1)
Immature Granulocytes: 0 %
LYMPHS ABS: 2.5 10*3/uL (ref 0.7–3.1)
Lymphs: 42 %
MCH: 29.3 pg (ref 26.6–33.0)
MCHC: 32.3 g/dL (ref 31.5–35.7)
MCV: 91 fL (ref 79–97)
MONOS ABS: 0.5 10*3/uL (ref 0.1–0.9)
Monocytes: 9 %
Neutrophils Absolute: 2.8 10*3/uL (ref 1.4–7.0)
Neutrophils: 48 %
PLATELETS: 191 10*3/uL (ref 150–450)
RBC: 5.53 x10E6/uL (ref 4.14–5.80)
RDW: 14.4 % (ref 12.3–15.4)
WBC: 5.9 10*3/uL (ref 3.4–10.8)

## 2018-01-28 LAB — URINALYSIS, DIPSTICK ONLY
BILIRUBIN UA: NEGATIVE
GLUCOSE, UA: NEGATIVE
Leukocytes, UA: NEGATIVE
Nitrite, UA: NEGATIVE
RBC, UA: NEGATIVE
SPEC GRAV UA: 1.027 (ref 1.005–1.030)
UUROB: 0.2 mg/dL (ref 0.2–1.0)
pH, UA: 5 (ref 5.0–7.5)

## 2018-01-28 LAB — TSH: TSH: 0.721 u[IU]/mL (ref 0.450–4.500)

## 2018-01-28 LAB — VITAMIN B12: Vitamin B-12: 472 pg/mL (ref 232–1245)

## 2018-02-17 ENCOUNTER — Encounter: Payer: Self-pay | Admitting: Physician Assistant

## 2018-02-17 ENCOUNTER — Ambulatory Visit: Payer: PRIVATE HEALTH INSURANCE | Admitting: Physician Assistant

## 2018-02-17 ENCOUNTER — Other Ambulatory Visit: Payer: Self-pay

## 2018-02-17 VITALS — BP 116/70 | HR 91 | Temp 98.0°F | Resp 16 | Ht 74.02 in | Wt 246.0 lb

## 2018-02-17 DIAGNOSIS — R0602 Shortness of breath: Secondary | ICD-10-CM | POA: Diagnosis not present

## 2018-02-17 DIAGNOSIS — F411 Generalized anxiety disorder: Secondary | ICD-10-CM | POA: Diagnosis not present

## 2018-02-17 DIAGNOSIS — R7989 Other specified abnormal findings of blood chemistry: Secondary | ICD-10-CM

## 2018-02-17 LAB — BASIC METABOLIC PANEL
BUN / CREAT RATIO: 6 — AB (ref 9–20)
BUN: 10 mg/dL (ref 6–24)
CHLORIDE: 103 mmol/L (ref 96–106)
CO2: 25 mmol/L (ref 20–29)
Calcium: 9.8 mg/dL (ref 8.7–10.2)
Creatinine, Ser: 1.57 mg/dL — ABNORMAL HIGH (ref 0.76–1.27)
GFR calc Af Amer: 61 mL/min/{1.73_m2} (ref >59)
GFR calc non Af Amer: 52 mL/min/{1.73_m2} — ABNORMAL LOW (ref >59)
GLUCOSE: 106 mg/dL — AB (ref 65–99)
Potassium: 4 mmol/L (ref 3.5–5.2)
Sodium: 144 mmol/L (ref 134–144)

## 2018-02-17 NOTE — Patient Instructions (Addendum)
  It was a pleasure seeing you today. I recommend increasing water consumption to at least 64 ounces of water per day. Avoid over-the-counter medications such as Advil, Aleve, or ibuprofen. You may use Tylenol for joint pain. Please pick up your anxiety medication and use as needed.  Follow-up in 6 weeks for complete physical exam.  Below is contact infor for the pulmonology you were referred to.  You can call them and schedule your appointment.  [facility] Address: [address], [place], [place] [address] Phone: [phone]  Thank you for letting me participate in your health and well being.   IF you received an x-ray today, you will receive an invoice from [facility]. Please contact [facility] at [phone] with questions or concerns regarding your invoice.   IF you received labwork today, you will receive an invoice from [facility]. Please contact [facility] at [phone] with questions or concerns regarding your invoice.   Our billing staff will not be able to assist you with questions regarding bills from these companies.  You will be contacted with the lab results as soon as they are available. The fastest way to get your results is to activate your My Chart account. Instructions are located on the last page of this paperwork. If you have not heard from us regarding the results in 2 weeks, please contact this office.

## 2018-02-17 NOTE — Progress Notes (Signed)
Chris LefortCorey Williams  MRN: [id number] DOB: Jul 13, 1970  Subjective:  Chris LefortCorey Williams is a 47 y.o. male seen in office today for a chief complaint of f/u on anxiety.  Last seen on 01/27/18. Given Rx for hydroxyzine. Please see that note for additional details. Today, reports he has not been able to pick up medication. He did not reach out to a local therapist. He has been on the road for the past 2 weeks.  Has only had a few episodes of increased anxiety, where his sleep was disturbed. In terms of SOB, still occurring when he is exposed to hot or cold. Will use albuterol inhaler with relief. Has not heard from pulmonology referral yet. Denies SI and HI. No other questions or concerns.   Review of Systems  Constitutional: Negative for chills, diaphoresis and fever.  Respiratory: Negative for cough and wheezing.   Musculoskeletal: Positive for arthralgias (has had knee pain for years, uses OTC NSAIDs occassionally, followed by orthopedics).  Psychiatric/Behavioral: Negative for confusion, dysphoric mood and hallucinations.    Patient Active Problem List   Diagnosis Date Noted  . Nephrolithiasis 01/21/2018  . Pain of left hip joint 10/05/2017  . Hypokalemia 05/17/2016  . Acute renal failure (ARF) ([facility]) 05/17/2016  . Dehydration 05/17/2016  . Rhabdomyolysis 05/17/2016  . Chest pain 05/17/2016  . Hypercalcemia 05/17/2016    Current Outpatient Medications on File Prior to Visit  Medication Sig Dispense Refill  . albuterol (PROVENTIL HFA;VENTOLIN HFA) 108 (90 Base) MCG/ACT inhaler Inhale 2 puffs into the lungs every 4 (four) hours as needed for wheezing or shortness of breath (cough, shortness of breath or wheezing.). 1 Inhaler 1  . albuterol (PROVENTIL) (2.5 MG/3ML) 0.083% nebulizer solution Take 3 mLs (2.5 mg total) by nebulization every 6 (six) hours as needed for wheezing or shortness of breath. 150 mL 0  . hydrOXYzine (ATARAX/VISTARIL) 25 MG tablet Take 0.5-1 tablets (12.5-25 mg total) by mouth every 8  (eight) hours as needed. 30 tablet 0  . loratadine (CLARITIN) 10 MG tablet Take 10 mg by mouth once.    . Multiple Vitamin (MULTIVITAMIN WITH MINERALS) TABS tablet Take 1 tablet by mouth daily.     No current facility-administered medications on file prior to visit.     No Known Allergies   Objective:  BP 116/70   Pulse 91   Temp 98 F (36.7 C) (Oral)   Resp 16   Ht 6' 2.02" (1.88 m)   Wt 246 lb (111.6 kg)   SpO2 97%   BMI 31.57 kg/m   Physical Exam  Constitutional: He is oriented to person, place, and time. He appears well-developed and well-nourished.  HENT:  Head: Normocephalic and atraumatic.  Eyes: Conjunctivae are normal.  Neck: Normal range of motion.  Cardiovascular: Normal rate, regular rhythm and normal heart sounds.  Pulmonary/Chest: Effort normal and breath sounds normal. He has no wheezes.  Neurological: He is alert and oriented to person, place, and time.  Skin: Skin is warm and dry.  Psychiatric: He has a normal mood and affect.  Vitals reviewed.   GAD 7 : Generalized Anxiety Score 02/17/2018 01/27/2018  Nervous, Anxious, on Edge 1 2  Control/stop worrying 3 1  Worry too much - different things 3 2  Trouble relaxing 1 3  Restless 1 1  Easily annoyed or irritable 1 1  Afraid - awful might happen 3 3  Total GAD 7 Score 13 13     Assessment and Plan :  1. Anxiety state Rec  picking up medication from the pharmacy and using as needed. Follow up in ~6 weeks.  2. Elevated serum creatinine Labs pending. Avoid OTC ibuprofen, advil, aleve. May use OTC tylenol for joint aches.  - Basic metabolic panel  3. Shortness of breath Asx today. Referral sent to [facility]. Pt given contact info so he can call to schedule appointment.    Benjiman CoreBrittany Artem Bunte PA-C  [facility] at [facility]  [facility] 02/17/2018 9:02 AM

## 2018-02-18 ENCOUNTER — Encounter: Payer: Self-pay | Admitting: Physician Assistant

## 2018-02-18 DIAGNOSIS — N182 Chronic kidney disease, stage 2 (mild): Secondary | ICD-10-CM | POA: Insufficient documentation

## 2018-02-19 ENCOUNTER — Institutional Professional Consult (permissible substitution): Payer: Self-pay | Admitting: Pulmonary Disease

## 2018-03-07 ENCOUNTER — Ambulatory Visit: Payer: 59 | Admitting: Pulmonary Disease

## 2018-03-07 ENCOUNTER — Encounter: Payer: Self-pay | Admitting: Pulmonary Disease

## 2018-03-07 VITALS — BP 118/70 | HR 94 | Ht 74.0 in | Wt 249.0 lb

## 2018-03-07 DIAGNOSIS — J4540 Moderate persistent asthma, uncomplicated: Secondary | ICD-10-CM | POA: Diagnosis not present

## 2018-03-07 DIAGNOSIS — R0602 Shortness of breath: Secondary | ICD-10-CM | POA: Diagnosis not present

## 2018-03-07 DIAGNOSIS — R0683 Snoring: Secondary | ICD-10-CM

## 2018-03-07 LAB — NITRIC OXIDE: Nitric Oxide: 39

## 2018-03-07 MED ORDER — FLUTICASONE FUROATE 100 MCG/ACT IN AEPB: 1.0000 | INHALATION_SPRAY | Freq: Every day | RESPIRATORY_TRACT | 5 refills | Status: DC

## 2018-03-07 NOTE — Progress Notes (Signed)
[facility]  Chief Complaint  Patient presents with  . Follow-up    pt states after taking claritin 27mo aago he developed sob. pt reports of sob with heat & exertion.    Constitutional: BP 118/70 (BP Location: Left Arm, Cuff Size: Normal)   Pulse 94   Ht 6\' 2"  (1.88 m)   Wt 249 lb (112.9 kg)   SpO2 98%   BMI 31.97 kg/m   History of Present Illness: Chris Williams is a 47 y.o. male with shortness of breath.  He has extensive family history of allergies and asthma.  He doesn't feel like he had any breathing issues or allergies until July 2019.  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  He noticed that going from cold to hot temperature he got short of breath.  He felt like he couldn't get air into his lungs.  He was also having puffy eyes and sinus congestion.  He took claritin and felt some improvement.  His symptoms however persisted.  He was seen by primary care and went to the ER.  He was given nebulizer treatment and prescribed albuterol.  These have helped.  He still needs to use albuterol several times per week.  He will walk up feeling short of breath.  His girlfriend has said that he snores, and will stop breathing at night.  He doesn't feel like his sleep was an issue prior to this past 1 month.  He was born in [place], but has lived in [place] since he was 47 yrs old.  Never smoked.  No history of pneumonia or TB.  No animal/bird exposures.    He denies skin rash, reflux, leg swelling, gland swelling.  No medicine allergies, including when he takes aspirin or NSAIDs.  No food allergies.  Never had allergy testing before.  CXR from 01/21/18 (reviewed by me) >> normal except for mild scoliosis.   Comprehensive Respiratory Exam:  Appearance - well kempt  ENMT - nasal mucosa moist, turbinates clear, midline nasal septum, no dental lesions, no gingival bleeding, no oral exudates, no tonsillar  hypertrophy, MP 4, enlarged tongue Neck - no masses, trachea midline, no thyromegaly, no elevation in JVP Respiratory - normal appearance of chest wall, normal respiratory effort w/o accessory muscle use, no dullness on percussion, no wheezing or rales CV - s1s2 regular rate and rhythm, no murmurs, no peripheral edema, no varicosities, radial pulses symmetric GI - soft, non tender, no masses, no hepatosplenomegaly Lymph - no adenopathy noted in neck and axillary areas MSK - normal muscle strength and tone, normal gait Ext - no cyanosis, clubbing, or joint inflammation noted Skin - no rashes, lesions, or ulcers Neuro - oriented to person, place, and time Psych - normal mood and affect  Discussion: His symptoms are consistent with allergic asthma.  He has extensive family history of asthma and allergies.  He has clinical improvement with as needed albuterol and OCT antihistamine therapy.  He has elevated FeNO testing today which is indicative of ongoing inflammation in his airways related to asthma.  He has snoring and apnea while asleep.  It is uncertain whether this is solely related to his asthma.  Assessment/Plan:  Allergic asthma. - will have his start arnuity - will arrange for PFT - might need further allergy assessment if his symptoms persist - prn albuterol - will need to discuss with him about flu and pneumonia vaccination at next visit -  discussed importance of environmental control techniques  Snoring. - if this persists after his asthma is controlled, then he might need further sleep assessment   Patient Instructions  Arnuity one puff daily, and rinse mouth after each use Will arrange for pulmonary function test Follow up in 4 weeks with Dr. Craige CottaSood or Nurse Practitioner   Coralyn HellingVineet Cordelro Gautreau, MD [facility] 03/07/2018, 11:27 AM  Flow Sheet  Pulmonary tests: FeNO 03/07/18 >> 39  Sleep tests:  Cardiac tests: Echo 05/17/16 >> EF 55%  Review of  Systems: Negative except in HPI  Past Medical History: He  has a past medical history of Kidney stone.  Past Surgical History: He  has a past surgical history that includes Cystoscopy/retrograde/ureteroscopy/stone extraction with basket (Right, 09/29/2012) and Holmium laser application (Right, 09/29/2012).  Family History: His family history includes CAD in his other; Coronary artery disease in his other; Deep vein thrombosis in his other; Hypertension in his father and mother.  Social History: He  reports that he has never smoked. He has never used smokeless tobacco. He reports that he does not drink alcohol or use drugs.  Medications: Allergies as of 03/07/2018   No Known Allergies     Medication List        Accurate as of 03/07/18 11:27 AM. Always use your most recent med list.          albuterol 108 (90 Base) MCG/ACT inhaler Commonly known as:  PROVENTIL HFA;VENTOLIN HFA Inhale 2 puffs into the lungs every 4 (four) hours as needed for wheezing or shortness of breath (cough, shortness of breath or wheezing.).   albuterol (2.5 MG/3ML) 0.083% nebulizer solution Commonly known as:  PROVENTIL Take 3 mLs (2.5 mg total) by nebulization every 6 (six) hours as needed for wheezing or shortness of breath.   Fluticasone Furoate 100 MCG/ACT Aepb Inhale 1 puff into the lungs daily.   hydrOXYzine 25 MG tablet Commonly known as:  ATARAX/VISTARIL Take 0.5-1 tablets (12.5-25 mg total) by mouth every 8 (eight) hours as needed.   loratadine 10 MG tablet Commonly known as:  CLARITIN Take 10 mg by mouth once.   multivitamin with minerals Tabs tablet Take 1 tablet by mouth daily.

## 2018-03-07 NOTE — Patient Instructions (Signed)
Arnuity one puff daily, and rinse mouth after each use Will arrange for pulmonary function test Follow up in 4 weeks with Dr. Craige CottaSood or Nurse Practitioner

## 2018-03-19 NOTE — Progress Notes (Signed)
Chris Williams  MRN: [id number] DOB: [birth year]  Subjective:  Pt is a 47 y.o. male who presents for annual physical exam. Pt is fasting today.   Diet: Grilled chicken, some vegetables, fruits, not much dairy. Takes daily multivitamin. Drinks tea and soda.  Exercise: Currently only doing one day per week for about an hour. Has to have both left knee and hip surgery once approved by workers comp so it is difficult for him to do lower body exercises.  Sleep: 6-7 hours per night BM: Daily Urinary/Erectile sx: Denies urinary frequency, urgency, weak urinary stream, urinary dribbling, nocturia, decreased libido, and erectile dysfunction.  No family history of prostate cancer.  Last PSA was less than 1 year ago and was normal.  Last dental exam: 11/2017, brushes BID Last vision exam: Years ago, no contact lens or Rx eyeglasses.   Vaccinations      Tetanus: 2014       In terms of intermittent anxiety, hydroxyzine is working. Has only had to use it twice over the past month.   Patient Active Problem List   Diagnosis Date Noted  . CKD (chronic kidney disease) stage 2, GFR 60-89 ml/min 02/18/2018  . Nephrolithiasis 01/21/2018  . Pain of left hip joint 10/05/2017  . Hypokalemia 05/17/2016  . Acute renal failure (ARF) ([facility]) 05/17/2016  . Dehydration 05/17/2016  . Rhabdomyolysis 05/17/2016  . Chest pain 05/17/2016  . Hypercalcemia 05/17/2016    Current Outpatient Medications on File Prior to Visit  Medication Sig Dispense Refill  . albuterol (PROVENTIL HFA;VENTOLIN HFA) 108 (90 Base) MCG/ACT inhaler Inhale 2 puffs into the lungs every 4 (four) hours as needed for wheezing or shortness of breath (cough, shortness of breath or wheezing.). 1 Inhaler 1  . albuterol (PROVENTIL) (2.5 MG/3ML) 0.083% nebulizer solution Take 3 mLs (2.5 mg total) by nebulization every 6 (six) hours as needed for wheezing or shortness of breath. 150 mL 0  . Fluticasone Furoate (ARNUITY ELLIPTA) 100 MCG/ACT AEPB Inhale 1  puff into the lungs daily. 30 each 5  . hydrOXYzine (ATARAX/VISTARIL) 25 MG tablet Take 0.5-1 tablets (12.5-25 mg total) by mouth every 8 (eight) hours as needed. 30 tablet 0  . loratadine (CLARITIN) 10 MG tablet Take 10 mg by mouth once.    . Multiple Vitamin (MULTIVITAMIN WITH MINERALS) TABS tablet Take 1 tablet by mouth daily.     No current facility-administered medications on file prior to visit.     No Known Allergies  Social History   Socioeconomic History  . Marital status: Significant Other    Spouse name: Not on file  . Number of children: 3  . Years of education: Not on file  . Highest education level: Not on file  Occupational History  . Not on file  Social Needs  . Financial resource strain: Somewhat hard  . Food insecurity:    Worry: Never true    Inability: Never true  . Transportation needs:    Medical: No    Non-medical: No  Tobacco Use  . Smoking status: Never Smoker  . Smokeless tobacco: Never Used  Substance and Sexual Activity  . Alcohol use: No  . Drug use: No  . Sexual activity: Yes    Partners: Female    Comment: with monogamous partner  Lifestyle  . Physical activity:    Days per week: 1 day    Minutes per session: 60 min  . Stress: To some extent  Relationships  . Social connections:    Talks  on phone: More than three times a week    Gets together: Once a week    Attends religious service: More than 4 times per year    Active member of club or organization: Yes    Attends meetings of clubs or organizations: More than 4 times per year    Relationship status: Living with partner  Other Topics Concern  . Not on file  Social History Narrative   Pt is from [place], [place]. Has been in [place] for 30 years. Lives with significant other, daughter, and grandchild.     Past Surgical History:  Procedure Laterality Date  . CYSTOSCOPY/RETROGRADE/URETEROSCOPY/STONE EXTRACTION WITH BASKET Right 09/29/2012   Procedure:  CYSTOSCOPY/RETROGRADE/URETEROSCOPY/STONE EXTRACTION WITH BASKET;  Surgeon: Malka So, MD;  Location: [facility];  Service: Urology;  Laterality: Right;  . HOLMIUM LASER APPLICATION Right 02/18/7516   Procedure: HOLMIUM LASER APPLICATION;  Surgeon: Malka So, MD;  Location: [facility];  Service: Urology;  Laterality: Right;    Family History  Problem Relation Age of Onset  . Hypertension Father   . CAD Other   . Deep vein thrombosis Other   . Coronary artery disease Other   . Hypertension Mother     Review of Systems  Constitutional: Positive for fatigue ( Feels tired occasionally.  Would like to have testosterone checked as he is considering supplementing with over-the-counter testosterone.). Negative for activity change, appetite change, chills, diaphoresis, fever and unexpected weight change.  HENT: Negative for congestion, dental problem, drooling, ear discharge, ear pain, facial swelling, hearing loss, mouth sores, nosebleeds, postnasal drip, rhinorrhea, sinus pressure, sinus pain, sneezing, sore throat, tinnitus, trouble swallowing and voice change.   Eyes: Negative for photophobia, pain, discharge, redness, itching and visual disturbance.  Respiratory: Negative for apnea, cough, choking, chest tightness, shortness of breath, wheezing and stridor.   Cardiovascular: Negative for chest pain, palpitations and leg swelling.  Gastrointestinal: Negative for abdominal distention, abdominal pain, anal bleeding, blood in stool, constipation, diarrhea, nausea, rectal pain and vomiting.  Endocrine: Negative for cold intolerance, heat intolerance, polydipsia, polyphagia and polyuria.  Genitourinary: Negative for decreased urine volume, difficulty urinating, discharge, dysuria, enuresis, flank pain, frequency, genital sores, hematuria, penile pain, penile swelling, scrotal swelling, testicular pain and urgency.  Musculoskeletal: Negative for arthralgias, back pain, gait problem, joint swelling, myalgias,  neck pain and neck stiffness.  Skin: Negative for color change, pallor, rash and wound.  Allergic/Immunologic: Negative for environmental allergies, food allergies and immunocompromised state.  Neurological: Negative for dizziness, tremors, seizures, syncope, facial asymmetry, speech difficulty, weakness, light-headedness, numbness and headaches.  Hematological: Negative for adenopathy. Does not bruise/bleed easily.  Psychiatric/Behavioral: Negative for agitation, behavioral problems, confusion, decreased concentration, dysphoric mood, hallucinations, self-injury, sleep disturbance and suicidal ideas. The patient is not nervous/anxious and is not hyperactive.     Objective:  BP 118/90   Pulse 81   Temp 98.2 F (36.8 C) (Oral)   Resp 16   Ht 6' 1" (1.854 m)   Wt 244 lb 9.6 oz (110.9 kg)   SpO2 96%   BMI 32.27 kg/m   Physical Exam  Constitutional: He is oriented to person, place, and time. He appears well-developed and well-nourished. No distress.  HENT:  Head: Normocephalic and atraumatic.  Right Ear: Hearing, tympanic membrane, external ear and ear canal normal.  Left Ear: Hearing, tympanic membrane, external ear and ear canal normal.  Nose: Nose normal.  Mouth/Throat: Uvula is midline, oropharynx is clear and moist and mucous membranes are normal. No oropharyngeal exudate.  Eyes: Pupils are equal, round, and reactive to light. Conjunctivae and EOM are normal.  Neck: Trachea normal and normal range of motion.  Cardiovascular: Normal rate, regular rhythm, normal heart sounds and intact distal pulses.  Pulmonary/Chest: Effort normal and breath sounds normal.  Abdominal: Soft. Normal appearance and bowel sounds are normal.  Musculoskeletal: Normal range of motion.  Lymphadenopathy:       Head (right side): No submental, no submandibular, no tonsillar, no preauricular, no posterior auricular and no occipital adenopathy present.       Head (left side): No submental, no submandibular, no  tonsillar, no preauricular, no posterior auricular and no occipital adenopathy present.    He has no cervical adenopathy.       Right: No supraclavicular adenopathy present.       Left: No supraclavicular adenopathy present.  Neurological: He is alert and oriented to person, place, and time. He has normal strength and normal reflexes.  Skin: Skin is warm and dry.  Vitals reviewed.   Visual Acuity Screening   Right eye Left eye Both eyes  Without correction: 20/15 20/20 20/13  With correction:       Assessment and Plan :  Discussed healthy lifestyle, diet, exercise, preventative care, vaccinations, and addressed patient's concerns. Plan for follow up as needed. Otherwise, plan for specific conditions below.  1. Annual physical exam Await lab results. 2. Elevated serum creatinine Labs pending. - CMP14+EGFR  3. Screening, lipid - Lipid panel  4. Screen for STD (sexually transmitted disease) - Hepatitis panel, acute - GC/Chlamydia Probe Amp - HIV antibody - RPR - Trichomonas vaginalis, RNA  5. Screening for prostate cancer Had discussion about PSA screening.  Current risk factors for prostate cancer are race. Last PSA within a year ago was normal. No sx. He declines PSA testing at this time. He will consider it next year.   6. Other fatigue Discussed that there could be multiple etiologies for fatigue. His fatigue is not severe.  He does does not endorse any other features of low testosterone.  Do not recommend over-the-counter supplementation with testosterone if levels are normal.  Has had TSH and CBC checked within the past two months it has been normal.  Will check testosterone at this time. - Testosterone,Free and Total  7. Anxiety state Controlled at this time with infrequent use of hydroxyzine.  Advised him to follow-up as needed.  Tenna Delaine, PA-C  [facility] 03/20/2018 9:02 AM

## 2018-03-20 ENCOUNTER — Other Ambulatory Visit: Payer: Self-pay

## 2018-03-20 ENCOUNTER — Ambulatory Visit (INDEPENDENT_AMBULATORY_CARE_PROVIDER_SITE_OTHER): Payer: PRIVATE HEALTH INSURANCE | Admitting: Physician Assistant

## 2018-03-20 ENCOUNTER — Encounter: Payer: Self-pay | Admitting: Physician Assistant

## 2018-03-20 VITALS — BP 118/90 | HR 81 | Temp 98.2°F | Resp 16 | Ht 73.0 in | Wt 244.6 lb

## 2018-03-20 DIAGNOSIS — Z0001 Encounter for general adult medical examination with abnormal findings: Secondary | ICD-10-CM | POA: Diagnosis not present

## 2018-03-20 DIAGNOSIS — R7989 Other specified abnormal findings of blood chemistry: Secondary | ICD-10-CM | POA: Diagnosis not present

## 2018-03-20 DIAGNOSIS — Z113 Encounter for screening for infections with a predominantly sexual mode of transmission: Secondary | ICD-10-CM | POA: Diagnosis not present

## 2018-03-20 DIAGNOSIS — F411 Generalized anxiety disorder: Secondary | ICD-10-CM | POA: Diagnosis not present

## 2018-03-20 DIAGNOSIS — Z13220 Encounter for screening for lipoid disorders: Secondary | ICD-10-CM

## 2018-03-20 DIAGNOSIS — R5383 Other fatigue: Secondary | ICD-10-CM | POA: Diagnosis not present

## 2018-03-20 DIAGNOSIS — Z0000 Encounter for general adult medical examination without abnormal findings: Secondary | ICD-10-CM

## 2018-03-20 DIAGNOSIS — Z125 Encounter for screening for malignant neoplasm of prostate: Secondary | ICD-10-CM

## 2018-03-20 NOTE — Patient Instructions (Addendum)

## 2018-03-21 LAB — LIPID PANEL
CHOLESTEROL TOTAL: 207 mg/dL — AB (ref 100–199)
Chol/HDL Ratio: 5.6 ratio — ABNORMAL HIGH (ref 0.0–5.0)
HDL: 37 mg/dL — ABNORMAL LOW (ref >39)
LDL CALC: 122 mg/dL — AB (ref 0–99)
Triglycerides: 242 mg/dL — ABNORMAL HIGH (ref 0–149)
VLDL CHOLESTEROL CAL: 48 mg/dL — AB (ref 5–40)

## 2018-03-21 LAB — CMP14+EGFR
ALK PHOS: 63 IU/L (ref 39–117)
ALT: 22 IU/L (ref 0–44)
AST: 15 IU/L (ref 0–40)
Albumin/Globulin Ratio: 1.7 (ref 1.2–2.2)
Albumin: 4.4 g/dL (ref 3.5–5.5)
BUN/Creatinine Ratio: 8 — ABNORMAL LOW (ref 9–20)
BUN: 12 mg/dL (ref 6–24)
Bilirubin Total: 0.5 mg/dL (ref 0.0–1.2)
CO2: 23 mmol/L (ref 20–29)
CREATININE: 1.57 mg/dL — AB (ref 0.76–1.27)
Calcium: 9.7 mg/dL (ref 8.7–10.2)
Chloride: 103 mmol/L (ref 96–106)
GFR, EST AFRICAN AMERICAN: 60 mL/min/{1.73_m2} (ref >59)
GFR, EST NON AFRICAN AMERICAN: 52 mL/min/{1.73_m2} — AB (ref >59)
GLOBULIN, TOTAL: 2.6 g/dL (ref 1.5–4.5)
GLUCOSE: 99 mg/dL (ref 65–99)
Potassium: 4 mmol/L (ref 3.5–5.2)
SODIUM: 144 mmol/L (ref 134–144)
Total Protein: 7 g/dL (ref 6.0–8.5)

## 2018-03-21 LAB — HEPATITIS PANEL, ACUTE
HEP B C IGM: NEGATIVE
HEP B S AG: NEGATIVE
Hep A IgM: NEGATIVE
Hep C Virus Ab: <0.1 s/co ratio (ref 0.0–0.9)

## 2018-03-21 LAB — RPR: RPR Ser Ql: NONREACTIVE

## 2018-03-21 LAB — TESTOSTERONE,FREE AND TOTAL
TESTOSTERONE: 230 ng/dL — AB (ref 264–916)
Testosterone, Free: 8 pg/mL (ref 6.8–21.5)

## 2018-03-21 LAB — HIV ANTIBODY (ROUTINE TESTING W REFLEX): HIV SCREEN 4TH GENERATION: NONREACTIVE

## 2018-03-22 LAB — GC/CHLAMYDIA PROBE AMP
Chlamydia trachomatis, NAA: NEGATIVE
NEISSERIA GONORRHOEAE BY PCR: NEGATIVE

## 2018-03-22 LAB — TRICHOMONAS VAGINALIS, PROBE AMP: TRICH VAG BY NAA: NEGATIVE

## 2018-03-24 ENCOUNTER — Other Ambulatory Visit: Payer: Self-pay | Admitting: Physician Assistant

## 2018-03-24 DIAGNOSIS — N182 Chronic kidney disease, stage 2 (mild): Secondary | ICD-10-CM

## 2018-03-24 DIAGNOSIS — R7989 Other specified abnormal findings of blood chemistry: Secondary | ICD-10-CM

## 2018-03-24 NOTE — Progress Notes (Signed)
Orders Placed This Encounter  Procedures  . Ambulatory referral to Nephrology

## 2018-04-07 ENCOUNTER — Encounter: Payer: Self-pay | Admitting: Primary Care

## 2018-04-07 ENCOUNTER — Ambulatory Visit (INDEPENDENT_AMBULATORY_CARE_PROVIDER_SITE_OTHER): Payer: 59 | Admitting: Primary Care

## 2018-04-07 VITALS — BP 122/78 | HR 92 | Ht 70.66 in | Wt 245.4 lb

## 2018-04-07 DIAGNOSIS — J4540 Moderate persistent asthma, uncomplicated: Secondary | ICD-10-CM | POA: Diagnosis not present

## 2018-04-07 DIAGNOSIS — R0683 Snoring: Secondary | ICD-10-CM

## 2018-04-07 LAB — PULMONARY FUNCTION TEST
DL/VA % pred: 126 %
DL/VA: 5.94 ml/min/mmHg/L
DLCO unc % pred: 104 %
DLCO unc: 34.74 ml/min/mmHg
FEF 25-75 POST: 4.53 L/s
FEF 25-75 PRE: 4.06 L/s
FEF2575-%CHANGE-POST: 11 %
FEF2575-%PRED-PRE: 113 %
FEF2575-%Pred-Post: 127 %
FEV1-%Change-Post: 3 %
FEV1-%PRED-PRE: 101 %
FEV1-%Pred-Post: 105 %
FEV1-POST: 3.71 L
FEV1-PRE: 3.58 L
FEV1FVC-%CHANGE-POST: 1 %
FEV1FVC-%PRED-PRE: 102 %
FEV6-%CHANGE-POST: 2 %
FEV6-%PRED-PRE: 98 %
FEV6-%Pred-Post: 100 %
FEV6-Post: 4.3 L
FEV6-Pre: 4.2 L
FEV6FVC-%Change-Post: 1 %
FEV6FVC-%Pred-Post: 103 %
FEV6FVC-%Pred-Pre: 101 %
FVC-%CHANGE-POST: 1 %
FVC-%Pred-Post: 99 %
FVC-%Pred-Pre: 98 %
FVC-Post: 4.37 L
FVC-Pre: 4.29 L
POST FEV6/FVC RATIO: 100 %
PRE FEV1/FVC RATIO: 83 %
Post FEV1/FVC ratio: 85 %
Pre FEV6/FVC Ratio: 98 %
RV % PRED: 125 %
RV: 2.5 L
TLC % pred: 101 %
TLC: 7.18 L

## 2018-04-07 LAB — POCT EXHALED NITRIC OXIDE: FeNO level (ppb): 25

## 2018-04-07 MED ORDER — FLUTICASONE FUROATE 200 MCG/ACT IN AEPB: 1.0000 | INHALATION_SPRAY | Freq: Every day | RESPIRATORY_TRACT | 6 refills | Status: AC

## 2018-04-07 NOTE — Progress Notes (Signed)
PFT completed today. 04/07/18  

## 2018-04-07 NOTE — Patient Instructions (Addendum)
Increase Arnuity to - take 1 puff daily  (maintenace inhaler use every day)  Back up - Albuterol rescue inhaler, 2 puffs every 4-6 hours as needed for sob/wheezing   Labs today   FU in 4-8 weeks with Dr. Craige Cotta

## 2018-04-07 NOTE — Assessment & Plan Note (Addendum)
-   Symptoms have decreased some but continues to have daily SOB - PFTs showed no evidence of obstruction, no BD respone. Normal DLCO. FVC 4.37 (99%), FEV1 3.71 (105%), ratio 85 DLCO 34 (104%) - FENO 25 - Increase Arnuity to twice daily; Albuterol hfa prn  - Claritin as needed  - Check IgE and CBC with diff - FU in 4-8 weeks with Dr. Craige Cotta

## 2018-04-07 NOTE — Assessment & Plan Note (Signed)
-   Improved; only experiences snoring symptoms once a week - Monitor consider sleep assessment if persists

## 2018-04-07 NOTE — Progress Notes (Signed)
@Patient  ID: Chris Williams, male    DOB: 06-Jan-1971, 47 y.o.   MRN: [id number]  Chief Complaint  Patient presents with  . Follow-up    PFT completed today, doesn't feel like Arnuity is helping     Referring provider: Magdalene River, PA*  HPI: 47 year old male, never smoked. PMH CKD stage 2, allergies. Patient of Dr. Craige Cotta, seen for initial consult on 02/08/18 for sob with heat and exertion. Improved with albuterol and OTC antihistamine. Elevated FENO. Symptoms felt to be consistent with allergic asthma. Started on Arnuity. Needs PFTs.   Additionally, he reported snoring and apnea with sleep. If persists after asthma controlled he may need further sleep assessment.    04/07/2018 Patient presents today for 1 month follow-up with full PFTs. Continues Arnuity 100, doesn't notice much difference. States that he is not experiencing sob as often as before. Reports daily dyspnea, depends on work conditions or if its hot outside. 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 Symptoms last for 10-63mins. Rescue inhaler helps. Take Claritin only as needed. Not experiencing sleep symptoms as often, maybe once a week. No cough, wheezing or GERD.     Testing reviewed: Echo 05/17/16- EF 55% CXR 01/21/18- normal except for mild scoliosis PFTS 04/07/2018 - FVC 4.37 (99%), FEV1 3.71 (105%), ratio 85 DLCO 34 (104%) FENO 04/07/2018 - 25    No Known Allergies  Immunization History  Administered Date(s) Administered  . Tdap 07/09/2012    Past Medical History:  Diagnosis Date  . Kidney stone     Tobacco History: Social History   Tobacco Use  Smoking Status Never Smoker  Smokeless Tobacco Never Used   Counseling given: Not Answered   Outpatient Medications Prior to Visit  Medication Sig Dispense Refill  . albuterol (PROVENTIL HFA;VENTOLIN HFA) 108 (90 Base) MCG/ACT inhaler Inhale 2 puffs into the lungs every 4 (four) hours as needed for wheezing or shortness of breath (cough, shortness of  breath or wheezing.). 1 Inhaler 1  . albuterol (PROVENTIL) (2.5 MG/3ML) 0.083% nebulizer solution Take 3 mLs (2.5 mg total) by nebulization every 6 (six) hours as needed for wheezing or shortness of breath. 150 mL 0  . hydrOXYzine (ATARAX/VISTARIL) 25 MG tablet Take 0.5-1 tablets (12.5-25 mg total) by mouth every 8 (eight) hours as needed. 30 tablet 0  . loratadine (CLARITIN) 10 MG tablet Take 10 mg by mouth once.    . Multiple Vitamin (MULTIVITAMIN WITH MINERALS) TABS tablet Take 1 tablet by mouth daily.    . Fluticasone Furoate (ARNUITY ELLIPTA) 100 MCG/ACT AEPB Inhale 1 puff into the lungs daily. 30 each 5   No facility-administered medications prior to visit.     Review of Systems  Review of Systems  Constitutional: Negative.   HENT: Negative.   Respiratory: Positive for shortness of breath. Negative for cough and wheezing.   Cardiovascular: Negative.     Physical Exam  BP 122/78   Pulse 92   Ht 5' 10.66" (1.795 m)   Wt 245 lb 6.4 oz (111.3 kg)   SpO2 97%   BMI 34.56 kg/m  Physical Exam  Constitutional: He is oriented to person, place, and time. He appears well-developed and well-nourished.  HENT:  Head: Normocephalic and atraumatic.  Eyes: Pupils are equal, round, and reactive to light. EOM are normal.  Neck: Normal range of motion. Neck supple.  Cardiovascular: Normal rate, regular rhythm, normal heart sounds and intact distal pulses.  Pulmonary/Chest: Effort normal and breath sounds normal. No respiratory distress.  He has no wheezes.  Abdominal: Soft. Bowel sounds are normal. There is no tenderness.  Neurological: He is alert and oriented to person, place, and time.  Skin: Skin is warm and dry. No rash noted. No erythema.  Psychiatric: He has a normal mood and affect. His behavior is normal. Judgment normal.     Lab Results:  CBC    Component Value Date/Time   WBC 5.9 01/27/2018 1610   WBC 7.0 01/21/2018 2014   RBC 5.53 01/27/2018 1610   RBC 5.38 01/21/2018  2014   HGB 16.2 01/27/2018 1610   HCT 50.2 01/27/2018 1610   PLT 191 01/27/2018 1610   MCV 91 01/27/2018 1610   MCH 29.3 01/27/2018 1610   MCH 30.3 01/21/2018 2014   MCHC 32.3 01/27/2018 1610   MCHC 33.1 01/21/2018 2014   RDW 14.4 01/27/2018 1610   LYMPHSABS 2.5 01/27/2018 1610   MONOABS 1.4 (H) 05/16/2016 2023   EOSABS 0.0 01/27/2018 1610   BASOSABS 0.0 01/27/2018 1610    BMET    Component Value Date/Time   NA 144 03/20/2018 0949   K 4.0 03/20/2018 0949   CL 103 03/20/2018 0949   CO2 23 03/20/2018 0949   GLUCOSE 99 03/20/2018 0949   GLUCOSE 102 (H) 01/21/2018 2014   BUN 12 03/20/2018 0949   CREATININE 1.57 (H) 03/20/2018 0949   CALCIUM 9.7 03/20/2018 0949   GFRNONAA 52 (L) 03/20/2018 0949   GFRAA 60 03/20/2018 0949    BNP No results found for: BNP  ProBNP No results found for: PROBNP  Imaging: No results found.   Assessment & Plan:   Asthma, extrinsic, moderate persistent, uncomplicated - Symptoms have decreased some but continues to have daily SOB - PFTs showed no evidence of obstruction, no BD respone. Normal DLCO. FVC 4.37 (99%), FEV1 3.71 (105%), ratio 85 DLCO 34 (104%) - FENO 25 - Increase Arnuity to twice daily; Albuterol hfa prn  - Claritin as needed  - Check IgE and CBC with diff - FU in 4-8 weeks with Dr. Craige Cotta   Snoring - Improved; only experiences snoring symptoms once a week - Monitor consider sleep assessment if persists      Glenford Bayley, NP 04/07/2018

## 2018-04-08 NOTE — Progress Notes (Signed)
Reviewed and agree with assessment/plan.   Denine Brotz, MD [facility] 07/04/2016, 12:24 PM Pager:  [phone]  

## 2018-05-07 ENCOUNTER — Ambulatory Visit: Payer: Self-pay | Admitting: Pulmonary Disease

## 2018-06-02 IMAGING — CR DG HIP (WITH OR WITHOUT PELVIS) 2-3V*L*
1 series · 3 of 3 positions shown · non-contrast
Comparison: None.

CLINICAL DATA: Fall at work today. Left hip pain. Initial
encounter.

EXAM:
DG HIP (WITH OR WITHOUT PELVIS) 2-3V LEFT

[Series 1: dg hip unilat w or w/o pelvis 2-3 views  · non-contrast · 0.14mm/px · 3 of 3 slices shown]
[im 1/3]
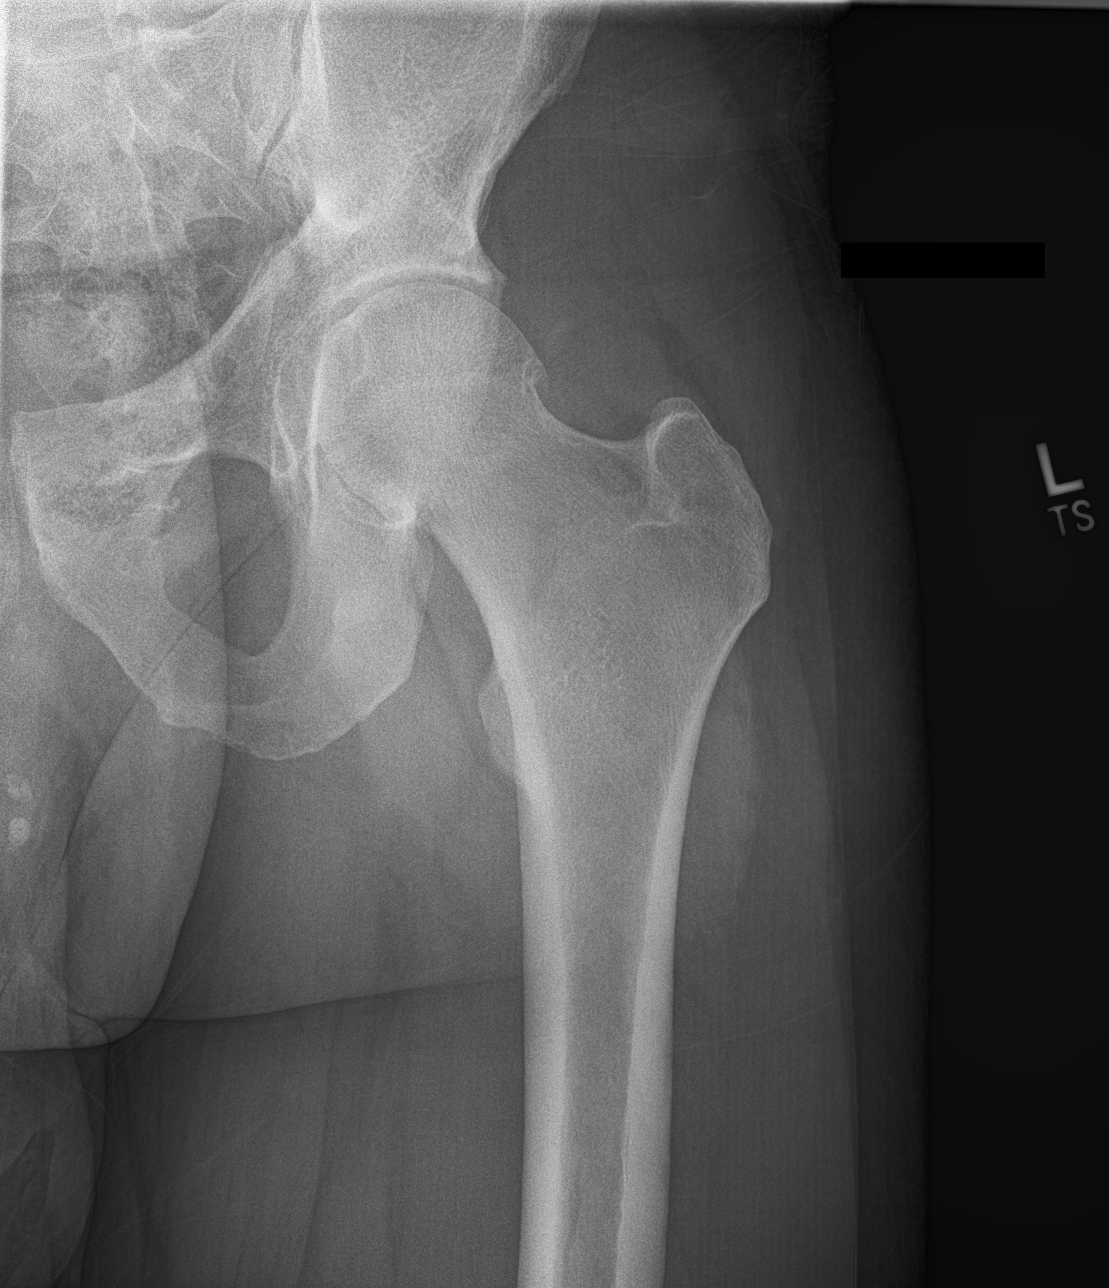
[im 2/3]
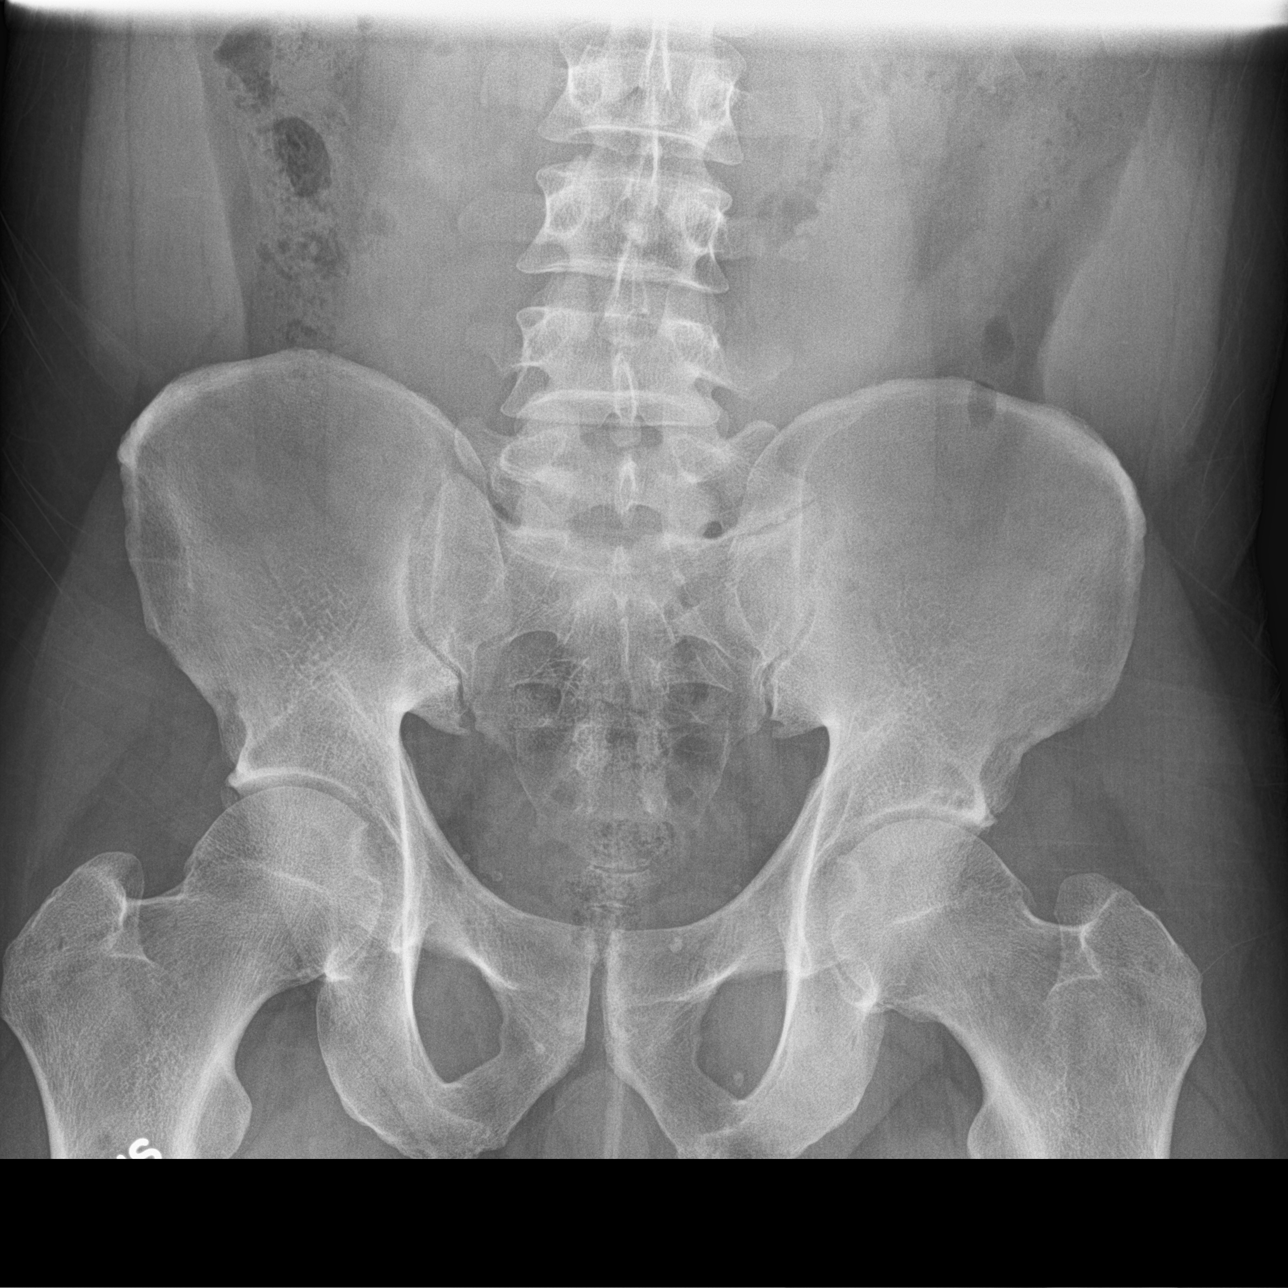
[im 3/3]
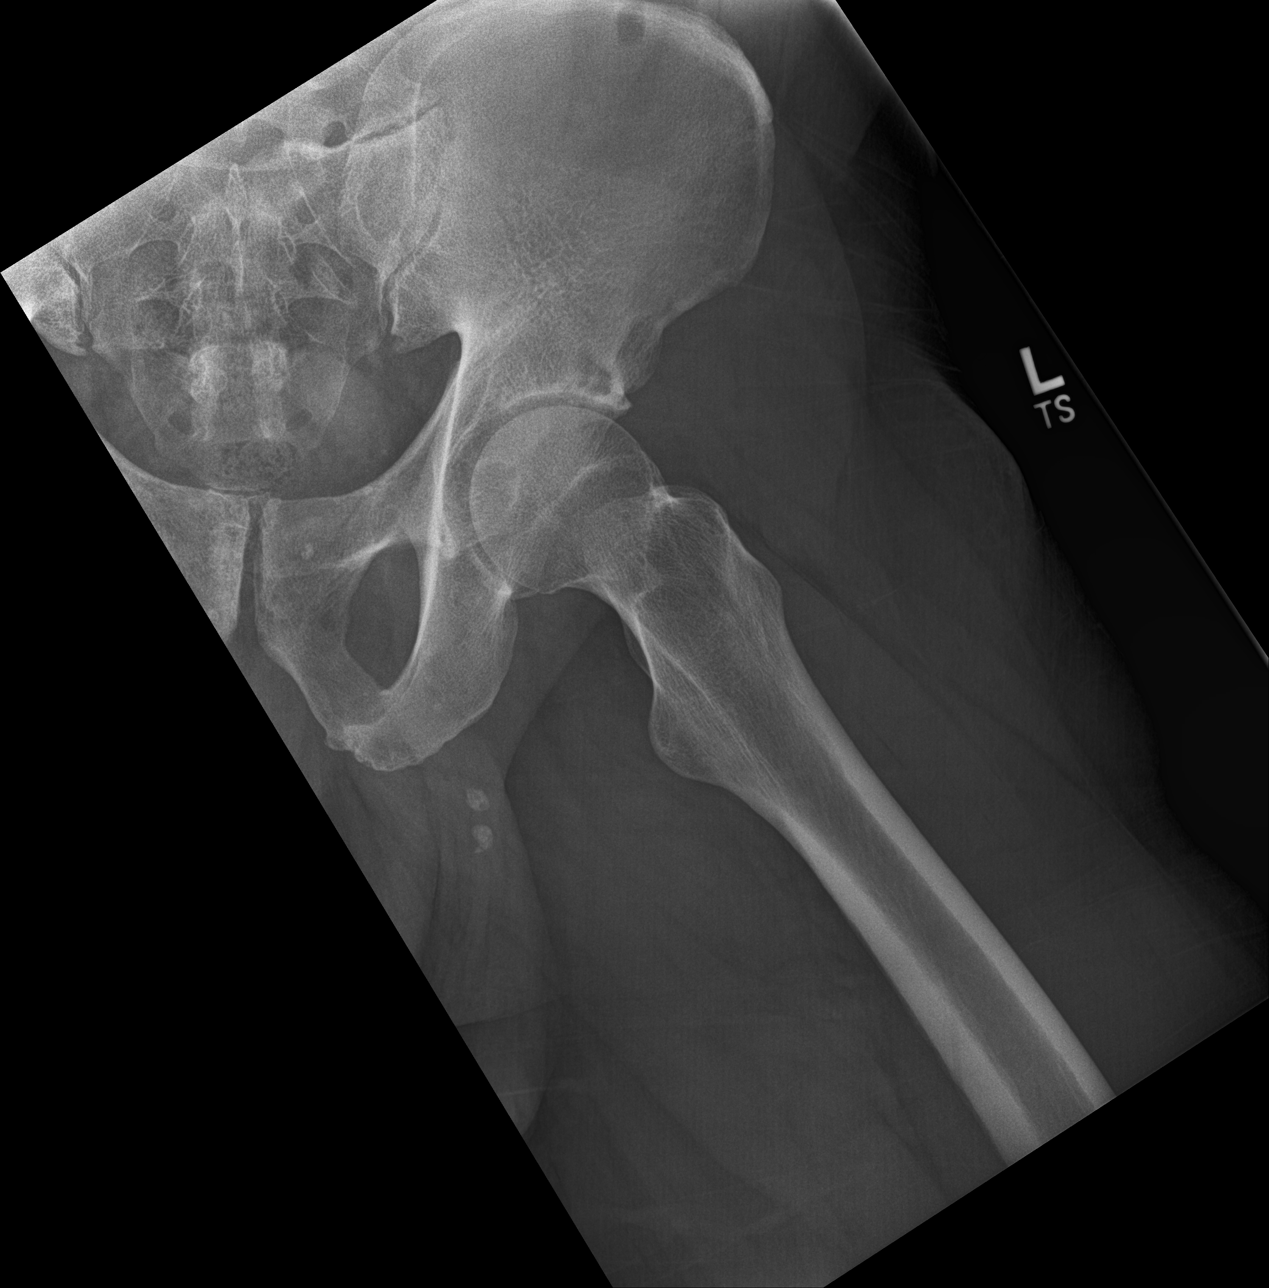

[3 of 3 positions shown; findings below may reference images not displayed]

FINDINGS: There is no evidence of hip fracture or dislocation. There is no
evidence of arthropathy or other focal bone abnormality.
IMPRESSION: Negative.

## 2018-06-24 ENCOUNTER — Emergency Department (HOSPITAL_BASED_OUTPATIENT_CLINIC_OR_DEPARTMENT_OTHER)
Admission: EM | Admit: 2018-06-24 | Discharge: 2018-06-24 | Disposition: A | Discharge status: Home / Self Care | Payer: 59 | Attending: Emergency Medicine | Admitting: Emergency Medicine

## 2018-06-24 ENCOUNTER — Encounter (HOSPITAL_BASED_OUTPATIENT_CLINIC_OR_DEPARTMENT_OTHER): Payer: Self-pay | Admitting: *Deleted

## 2018-06-24 ENCOUNTER — Other Ambulatory Visit: Payer: Self-pay

## 2018-06-24 DIAGNOSIS — N182 Chronic kidney disease, stage 2 (mild): Secondary | ICD-10-CM | POA: Insufficient documentation

## 2018-06-24 DIAGNOSIS — M79601 Pain in right arm: Secondary | ICD-10-CM

## 2018-06-24 DIAGNOSIS — R519 Headache, unspecified: Secondary | ICD-10-CM

## 2018-06-24 DIAGNOSIS — R51 Headache: Secondary | ICD-10-CM | POA: Insufficient documentation

## 2018-06-24 DIAGNOSIS — Z79899 Other long term (current) drug therapy: Secondary | ICD-10-CM | POA: Insufficient documentation

## 2018-06-24 DIAGNOSIS — R030 Elevated blood-pressure reading, without diagnosis of hypertension: Secondary | ICD-10-CM | POA: Insufficient documentation

## 2018-06-24 MED ORDER — METHOCARBAMOL 500 MG PO TABS: 500.0000 mg | ORAL_TABLET | Freq: Every evening | ORAL | 0 refills | Status: DC | PRN

## 2018-06-24 MED ORDER — METHOCARBAMOL 500 MG PO TABS
750.0000 mg | ORAL_TABLET | Freq: Once | ORAL | Status: AC
  Administered 2018-06-24: 750 mg via ORAL
  Filled 2018-06-24: qty 2

## 2018-06-24 MED ORDER — KETOROLAC TROMETHAMINE 15 MG/ML IJ SOLN
7.5000 mg | Freq: Once | INTRAMUSCULAR | Status: AC
  Administered 2018-06-24: 7.5 mg via INTRAMUSCULAR

## 2018-06-24 MED ORDER — PREDNISONE 10 MG (21) PO TBPK: ORAL_TABLET | Freq: Every day | ORAL | 0 refills | Status: DC

## 2018-06-24 MED ORDER — PREDNISONE 50 MG PO TABS
60.0000 mg | ORAL_TABLET | Freq: Once | ORAL | Status: AC
  Administered 2018-06-24: 60 mg via ORAL
  Filled 2018-06-24: qty 1

## 2018-06-24 MED ORDER — KETOROLAC TROMETHAMINE 15 MG/ML IJ SOLN
INTRAMUSCULAR | Status: AC
  Filled 2018-06-24: qty 1

## 2018-06-24 NOTE — ED Provider Notes (Signed)
[facility] EMERGENCY DEPARTMENT Provider Note   CSN: [id number] Arrival date & time: 06/24/18  2013     History   Chief Complaint Chief Complaint  Patient presents with  . Headache  . Hypertension    HPI Andre LefortCorey Williams is a 47 y.o. male presenting for evaluation of HA, HTN, and R arm pain.  She states for the past week, he has been having pain in his right arm.  Pain begins in his right shoulder, and shoots down his right arm.  He reports intermittent right hand tingling.  He denies dropping things with his hand.  He denies weakness in his hand.  He denies fall, trauma, or injury.  There is no neck pain.  He has been taking Tylenol without improvement of his symptoms.  In the past 3 days, patient reports pain is now radiating up into his head.  Pain is mostly on the right side of his neck and at his right temple.  It is intermittent.  He denies vision changes, slurred speech, numbness, chest pain, shortness of breath, nausea, vomiting abdominal pain, urinary symptoms, normal bowel movements.  At home today, he checked his blood pressure and found it to be elevated in the 150s over 106.  As such, patient's family and friends encouraged him to come to the ER for evaluation.  Patient denies a history of hypertension.  He does have a history of CKD.  States he takes no medications daily.  He has not been taking NSAIDs.  He denies tobacco, alcohol, or drug use.  HPI  Past Medical History:  Diagnosis Date  . Kidney stone     Patient Active Problem List   Diagnosis Date Noted  . Asthma, extrinsic, moderate persistent, uncomplicated 04/07/2018  . Snoring 04/07/2018  . CKD (chronic kidney disease) stage 2, GFR 60-89 ml/min 02/18/2018  . Nephrolithiasis 01/21/2018  . Pain of left hip joint 10/05/2017  . Hypokalemia 05/17/2016  . Acute renal failure (ARF) ([facility]) 05/17/2016  . Dehydration 05/17/2016  . Rhabdomyolysis 05/17/2016  . Chest pain 05/17/2016  . Hypercalcemia 05/17/2016      Past Surgical History:  Procedure Laterality Date  . CYSTOSCOPY/RETROGRADE/URETEROSCOPY/STONE EXTRACTION WITH BASKET Right 09/29/2012   Procedure: CYSTOSCOPY/RETROGRADE/URETEROSCOPY/STONE EXTRACTION WITH BASKET;  Surgeon: Anner CreteJohn J Wrenn, MD;  Location: [facility];  Service: Urology;  Laterality: Right;  . HOLMIUM LASER APPLICATION Right 09/29/2012   Procedure: HOLMIUM LASER APPLICATION;  Surgeon: Anner CreteJohn J Wrenn, MD;  Location: [facility];  Service: Urology;  Laterality: Right;        Home Medications    Prior to Admission medications   Medication Sig Start Date End Date Taking? Authorizing Provider  albuterol (PROVENTIL HFA;VENTOLIN HFA) 108 (90 Base) MCG/ACT inhaler Inhale 2 puffs into the lungs every 4 (four) hours as needed for wheezing or shortness of breath (cough, shortness of breath or wheezing.). 01/27/18   Benjiman CoreWiseman, Brittany D, PA-C  albuterol (PROVENTIL) (2.5 MG/3ML) 0.083% nebulizer solution Take 3 mLs (2.5 mg total) by nebulization every 6 (six) hours as needed for wheezing or shortness of breath. 01/27/18   Benjiman CoreWiseman, Brittany D, PA-C  Fluticasone Furoate (ARNUITY ELLIPTA) 200 MCG/ACT AEPB Inhale 1 puff into the lungs daily. 04/07/18   Glenford BayleyWalsh, Elizabeth W, NP  hydrOXYzine (ATARAX/VISTARIL) 25 MG tablet Take 0.5-1 tablets (12.5-25 mg total) by mouth every 8 (eight) hours as needed. 01/27/18   Benjiman CoreWiseman, Brittany D, PA-C  loratadine (CLARITIN) 10 MG tablet Take 10 mg by mouth once.    [provider]  methocarbamol (ROBAXIN) 500 MG tablet Take 1 tablet (500 mg total) by mouth at bedtime as needed for muscle spasms. 06/24/18   Sigourney Portillo, PA-C  Multiple Vitamin (MULTIVITAMIN WITH MINERALS) TABS tablet Take 1 tablet by mouth daily.    [provider]  predniSONE (STERAPRED UNI-PAK 21 TAB) 10 MG (21) TBPK tablet Take by mouth daily. Take 6 tabs by mouth daily  for 2 days, then 5 tabs for 2 days, then 4 tabs for 2 days, then 3 tabs for 2 days, 2 tabs for 2 days, then 1 tab by  mouth daily for 2 days 06/24/18   Kouper Spinella, PA-C    Family History Family History  Problem Relation Age of Onset  . Hypertension Father   . CAD Other   . Deep vein thrombosis Other   . Coronary artery disease Other   . Hypertension Mother   . Cancer Maternal Grandmother   . Hyperlipidemia Maternal Grandmother   . Stroke Maternal Grandmother   . Cancer Maternal Grandfather   . Hyperlipidemia Maternal Grandfather   . Stroke Maternal Grandfather   . Cancer Paternal Grandmother   . Hyperlipidemia Paternal Grandmother   . Cancer Paternal Grandfather   . Hyperlipidemia Paternal Grandfather     Social History Social History   Tobacco Use  . Smoking status: Never Smoker  . Smokeless tobacco: Never Used  Substance Use Topics  . Alcohol use: No  . Drug use: No     Allergies   Patient has no known allergies.   Review of Systems Review of Systems  Musculoskeletal: Positive for myalgias.  Neurological: Positive for headaches.  All other systems reviewed and are negative.    Physical Exam Updated Vital Signs BP (!) 148/104   Pulse 83   Temp 97.9 F (36.6 C) (Oral)   Resp 18   Ht 6\' 2"  (1.88 m)   Wt 113.4 kg   SpO2 99%   BMI 32.10 kg/m   Physical Exam Vitals signs and nursing note reviewed.  Constitutional:      General: He is not in acute distress.    Appearance: He is well-developed.     Comments: Sitting comfortably in the bed in no acute distress.  HENT:     Head: Normocephalic and atraumatic.  Eyes:     Extraocular Movements: Extraocular movements intact.     Right eye: No nystagmus.     Left eye: No nystagmus.     Conjunctiva/sclera: Conjunctivae normal.     Pupils: Pupils are equal, round, and reactive to light.  Neck:     Musculoskeletal: Normal range of motion and neck supple.     Comments: No neck stiffness or signs of meningismus. Cardiovascular:     Rate and Rhythm: Normal rate and regular rhythm.  Pulmonary:     Effort: Pulmonary  effort is normal. No respiratory distress.     Breath sounds: Normal breath sounds. No wheezing.  Abdominal:     General: Bowel sounds are normal. There is no distension.     Palpations: Abdomen is soft.     Tenderness: There is no abdominal tenderness.  Musculoskeletal: Normal range of motion.     Comments: Full active range of motion of bilateral upper extremities.  Movement causes right shoulder pain.  Tenderness palpation of the right trapezius, standing to the right side and neck musculature.  No tenderness palpation over midline C-spine.  Pulses intact bilaterally.  Sensation intact bilaterally.  Good cap refill bilaterally.  Skin:  General: Skin is warm and dry.     Capillary Refill: Capillary refill takes less than 2 seconds.  Neurological:     Mental Status: He is alert and oriented to person, place, and time.     GCS: GCS eye subscore is 4. GCS verbal subscore is 5. GCS motor subscore is 6.     Cranial Nerves: No cranial nerve deficit.     Sensory: No sensory deficit.     Motor: No weakness.     Comments: No obvious neurologic deficits.  CN intact.  Nose to finger intact.  Grip strength intact.  Psychiatric:        Mood and Affect: Mood normal.      ED Treatments / Results  Labs (all labs ordered are listed, but only abnormal results are displayed) Labs Reviewed - No data to display  EKG None  Radiology No results found.  Procedures Procedures (including critical care time)  Medications Ordered in ED Medications  ketorolac (TORADOL) 15 MG/ML injection 7.5 mg (7.5 mg Intramuscular Given 06/24/18 2202)  predniSONE (DELTASONE) tablet 60 mg (60 mg Oral Given 06/24/18 2201)  methocarbamol (ROBAXIN) tablet 750 mg (750 mg Oral Given 06/24/18 2201)     Initial Impression / Assessment and Plan / ED Course  I have reviewed the triage vital signs and the nursing notes.  Pertinent labs & imaging results that were available during my care of the patient were reviewed  by me and considered in my medical decision making (see chart for details).     Patient presenting for evaluation of headache, hypertension, and right arm pain.  Physical exam reassuring, no focal neurologic deficits.  Patient's blood pressure is mildly elevated, however I believe this is likely due to pain.  Symptoms in the right arm consistent with pinched nerve, however no neck pain, doubt concerning radiculopathy or myelopathy.  Symptoms are intermittent, and strength is intact.  Discussed symptomatic treatment with prednisone, muscle relaxers, muscle creams.  Patient's headache likely related to shoulder pain.  Symptomatic control given in the ED.  I do not believe he needs a CT at this time, as I have low suspicion for CVA, TIA, SAH, ICH, or infection. Case discussed with attending, Dr. Jacqulyn Bath agrees to plan.  Will have patient follow-up with primary care for recheck of his blood pressure evaluation of his symptoms.  At this time, patient appears safe for discharge.  Return precautions given.  Patient states he understands and agrees plan.  Final Clinical Impressions(s) / ED Diagnoses   Final diagnoses:  Acute nonintractable headache, unspecified headache type  Diffuse pain in right upper extremity  Elevated blood pressure reading    ED Discharge Orders         Ordered    predniSONE (STERAPRED UNI-PAK 21 TAB) 10 MG (21) TBPK tablet  Daily     06/24/18 2157    methocarbamol (ROBAXIN) 500 MG tablet  At bedtime PRN     06/24/18 2157           Alveria Apley, PA-C 06/24/18 2346    Long, Arlyss Repress, MD 06/25/18 1614

## 2018-06-24 NOTE — ED Notes (Signed)
Pt. Reports he has had a shocking feeling down the R arm in the last week and now a headache on the R side for the last 3 days.  Now tonight he has a high b/p.  Pt. Reports never a history of hight B/P.

## 2018-06-24 NOTE — Discharge Instructions (Signed)
Take prednisone as prescribed.  Take tylenol 3 times a day for further pain control. Use Robaxin as needed for muscle stiffness or soreness. Have caution, as this may make you tired or groggy. Do not drive or operate heavy machinery while taking this medication.  Use muscle creams (bengay, icy hot, salonpas) as needed for pain.  Follow up with your primary care doctor on 1 week for recheck of your symptoms and blood pressure.   Return to the ER if you develop changes, slurred speech, if you are dropping things with your hand, if the numbness is persistent, you develop neck pain/stiffness, or with any new, worsening, concerning symptoms.

## 2018-06-24 NOTE — ED Triage Notes (Signed)
Headache. He tested his BP at home and it was elevated. No hx of HTN.

## 2019-03-17 NOTE — H&P (Signed)
TOTAL HIP ADMISSION H&P  Patient is admitted for left total hip arthroplasty, anterior approach.  Subjective:  Chief Complaint:   Left hip trauma / pain  HPI: Chris Williams, 48 y.o. male, has a history of pain and functional disability in the left hip(s) due to trauma and arthritis and patient has failed non-surgical conservative treatments for greater than 12 weeks to include NSAID's and/or analgesics, corticosteriod injections and activity modification.  Onset of symptoms was abrupt starting September 25, 2016 with an incident at work gradually worsening course since that time.The patient noted no past surgery on the left hip(s).  Patient currently rates pain in the left hip at 9 out of 10 with activity. Patient has night pain, worsening of pain with activity and weight bearing, trendelenberg gait, pain that interfers with activities of daily living and pain with passive range of motion. Patient has evidence of joint space narrowing by imaging studies. This condition presents safety issues increasing the risk of falls.  There is no current active infection.  Risks, benefits and expectations were discussed with the patient.  Risks including but not limited to the risk of anesthesia, blood clots, nerve damage, blood vessel damage, failure of the prosthesis, infection and up to and including death.  Patient understand the risks, benefits and expectations and wishes to proceed with surgery.   PCP: Leonie Douglas, PA-C  D/C Plans:       Home  Post-op Meds:       No Rx given   Tranexamic Acid:      To be given - IV   Decadron:      Is to be given  FYI:      ASA  Norco  DME:   Equipment rx sent for  RW & 3-n-1  PT:   [facility]  Pharmacy: [facility]    Patient Active Problem List   Diagnosis Date Noted  . Asthma, extrinsic, moderate persistent, uncomplicated 59/93/5701  . Snoring 04/07/2018  . CKD (chronic kidney disease) stage 2, GFR 60-89 ml/min 02/18/2018  . Nephrolithiasis  01/21/2018  . Pain of left hip joint 10/05/2017  . Hypokalemia 05/17/2016  . Acute renal failure (ARF) ([facility]) 05/17/2016  . Dehydration 05/17/2016  . Rhabdomyolysis 05/17/2016  . Chest pain 05/17/2016  . Hypercalcemia 05/17/2016   Past Medical History:  Diagnosis Date  . Kidney stone     Past Surgical History:  Procedure Laterality Date  . CYSTOSCOPY/RETROGRADE/URETEROSCOPY/STONE EXTRACTION WITH BASKET Right 09/29/2012   Procedure: CYSTOSCOPY/RETROGRADE/URETEROSCOPY/STONE EXTRACTION WITH BASKET;  Surgeon: Malka So, MD;  Location: [facility];  Service: Urology;  Laterality: Right;  . HOLMIUM LASER APPLICATION Right 7/79/3903   Procedure: HOLMIUM LASER APPLICATION;  Surgeon: Malka So, MD;  Location: [facility];  Service: Urology;  Laterality: Right;    No current facility-administered medications for this encounter.    Current Outpatient Medications  Medication Sig Dispense Refill Last Dose  . albuterol (PROVENTIL HFA;VENTOLIN HFA) 108 (90 Base) MCG/ACT inhaler Inhale 2 puffs into the lungs every 4 (four) hours as needed for wheezing or shortness of breath (cough, shortness of breath or wheezing.). 1 Inhaler 1 Taking  . albuterol (PROVENTIL) (2.5 MG/3ML) 0.083% nebulizer solution Take 3 mLs (2.5 mg total) by nebulization every 6 (six) hours as needed for wheezing or shortness of breath. 150 mL 0 Taking  . Fluticasone Furoate (ARNUITY ELLIPTA) 200 MCG/ACT AEPB Inhale 1 puff into the lungs daily. 30 each 6   . hydrOXYzine (ATARAX/VISTARIL) 25 MG tablet Take  0.5-1 tablets (12.5-25 mg total) by mouth every 8 (eight) hours as needed. 30 tablet 0 Taking  . loratadine (CLARITIN) 10 MG tablet Take 10 mg by mouth once.   Taking  . methocarbamol (ROBAXIN) 500 MG tablet Take 1 tablet (500 mg total) by mouth at bedtime as needed for muscle spasms. 10 tablet 0   . Multiple Vitamin (MULTIVITAMIN WITH MINERALS) TABS tablet Take 1 tablet by mouth daily.   Taking  . predniSONE (STERAPRED UNI-PAK 21 TAB) 10  MG (21) TBPK tablet Take by mouth daily. Take 6 tabs by mouth daily  for 2 days, then 5 tabs for 2 days, then 4 tabs for 2 days, then 3 tabs for 2 days, 2 tabs for 2 days, then 1 tab by mouth daily for 2 days 42 tablet 0    No Known Allergies   Social History   Tobacco Use  . Smoking status: Never Smoker  . Smokeless tobacco: Never Used  Substance Use Topics  . Alcohol use: No    Family History  Problem Relation Age of Onset  . Hypertension Father   . CAD Other   . Deep vein thrombosis Other   . Coronary artery disease Other   . Hypertension Mother   . Cancer Maternal Grandmother   . Hyperlipidemia Maternal Grandmother   . Stroke Maternal Grandmother   . Cancer Maternal Grandfather   . Hyperlipidemia Maternal Grandfather   . Stroke Maternal Grandfather   . Cancer Paternal Grandmother   . Hyperlipidemia Paternal Grandmother   . Cancer Paternal Grandfather   . Hyperlipidemia Paternal Grandfather      Review of Systems  Constitutional: Negative.   HENT: Negative.   Eyes: Negative.   Respiratory: Negative.   Cardiovascular: Negative.   Gastrointestinal: Negative.   Genitourinary: Negative.   Musculoskeletal: Positive for joint pain.  Skin: Negative.   Neurological: Negative.   Endo/Heme/Allergies: Negative.   Psychiatric/Behavioral: The patient is nervous/anxious.     Objective:  Physical Exam  Constitutional: He is oriented to person, place, and time. He appears well-developed.  HENT:  Head: Normocephalic.  Eyes: Pupils are equal, round, and reactive to light.  Neck: Neck supple. No JVD present. No tracheal deviation present. No thyromegaly present.  Cardiovascular: Normal rate, regular rhythm and intact distal pulses.  Respiratory: Effort normal and breath sounds normal. No respiratory distress. He has no wheezes.  GI: Soft. There is no abdominal tenderness. There is no guarding.  Musculoskeletal:     Left hip: He exhibits decreased range of motion, decreased  strength, tenderness and bony tenderness. He exhibits no swelling, no deformity and no laceration.  Lymphadenopathy:    He has no cervical adenopathy.  Neurological: He is alert and oriented to person, place, and time. A sensory deficit (occasional numbness in the toes of the left foot) is present.  Skin: Skin is warm and dry.  Psychiatric: He has a normal mood and affect.     Labs:  Estimated body mass index is 32.1 kg/m as calculated from the following:   Height as of 06/24/18: 6\' 2"  (1.88 m).   Weight as of 06/24/18: 113.4 kg.   Imaging Review Plain radiographs demonstrate severe degenerative joint disease of the left hip(s). The bone quality appears to be good for age and reported activity level.      Assessment/Plan:  End stage arthritis, left hip(s)  The patient history, physical examination, clinical judgement of the provider and imaging studies are consistent with end stage degenerative joint disease  of the left hip(s) and total hip arthroplasty is deemed medically necessary. The treatment options including medical management, injection therapy, arthroscopy and arthroplasty were discussed at length. The risks and benefits of total hip arthroplasty were presented and reviewed. The risks due to aseptic loosening, infection, stiffness, dislocation/subluxation,  thromboembolic complications and other imponderables were discussed.  The patient acknowledged the explanation, agreed to proceed with the plan and consent was signed. Patient is being admitted for inpatient treatment for surgery, pain control, PT, OT, prophylactic antibiotics, VTE prophylaxis, progressive ambulation and ADL's and discharge planning.The patient is planning to be discharged home.    Anastasio AuerbachMatthew S. Meagan Spease   PA-C  03/17/2019, 10:41 AM

## 2019-03-26 ENCOUNTER — Encounter (HOSPITAL_COMMUNITY): Payer: Self-pay

## 2019-03-26 NOTE — Progress Notes (Signed)
PCP - [place] Cardiologist -   PPM/ICD -  Device Orders -  Rep Notified  Chest x-ray -  EKG -  Stress Test -  ECHO -  Cardiac Cath -   Sleep Study -  CPAP -   Fasting Blood Sugar -  Checks Blood Sugar _____ times a day  Blood Thinner Instructions: Aspirin Instructions:  ERAS Protcol -yes PRE-SURGERY Ensure - yes  COVID TEST-    Anesthesia review: creatine 1.57 BP elevated at preop pt. To see [facility] urgent care for clearance  Patient denies shortness of breath, fever, cough and chest pain at PAT appointment   Patient verbalized understanding of instructions that were given to them at the PAT appointment. Patient was also instructed that they will need to review over the PAT instructions again at home before surgery.

## 2019-03-26 NOTE — Patient Instructions (Addendum)
DUE TO COVID-19 ONLY ONE VISITOR IS ALLOWED TO COME WITH YOU AND STAY IN THE WAITING ROOM ONLY DURING PRE OP AND PROCEDURE DAY OF SURGERY. THE 1 VISITOR MAY VISIT WITH YOU AFTER SURGERY IN YOUR PRIVATE ROOM DURING VISITING HOURS ONLY!  YOU NEED TO HAVE A COVID 19 TEST ON_______ @_______ , THIS TEST MUST BE DONE BEFORE SURGERY, COME  [place], [place] , [address].  ([place]) ONCE YOUR COVID TEST IS COMPLETED, PLEASE BEGIN THE QUARANTINE INSTRUCTIONS AS OUTLINED IN YOUR HANDOUT.                Chris Williams  03/26/2019   Your procedure is scheduled on: 03-31-19   Report to [facility] Main  Entrance   Report to short stay  at      0530 AM     Call this number if you have problems the morning of surgery [phone]    Remember: NO SOLID FOOD AFTER MIDNIGHT THE NIGHT PRIOR TO SURGERY. NOTHING BY MOUTH EXCEPT CLEAR LIQUIDS UNTIL     0415 am . PLEASE FINISH ENSURE DRINK PER SURGEON ORDER  WHICH NEEDS TO BE COMPLETED AT   [place] am then nothing by mouth .    CLEAR LIQUID DIET   Foods Allowed                                                                     Foods Excluded  Coffee and tea, regular and decaf                             liquids that you cannot  Plain Jell-O any favor except red or purple                                           see through such as: Fruit ices (not with fruit pulp)                                     milk, soups, orange juice  Iced Popsicles                                    All solid food Carbonated beverages, regular and diet                                    Cranberry, grape and apple juices Sports drinks like Gatorade Lightly seasoned clear broth or consume(fat free) Sugar, honey syrup  Sample Menu Breakfast                                Lunch  Supper Cranberry juice                    Beef broth                            Chicken broth Jell-O                                      Grape juice                           Apple juice Coffee or tea                        Jell-O                                      Popsicle                                                Coffee or tea                        Coffee or tea  _____________________________________________________________________    BRUSH YOUR TEETH MORNING OF SURGERY AND RINSE YOUR MOUTH OUT, NO CHEWING GUM CANDY OR MINTS.     Take these medicines the morning of surgery with A SIP OF WATER: inhalers bring with you, nebulizer if needed at home                                 You may not have any metal on your body including hair pins and              piercings  Do not wear jewelry,lotions, powders or perfumes, deodorant                     Men may shave face and neck.   Do not bring valuables to the hospital. [name] IS NOT             RESPONSIBLE   FOR VALUABLES.  Contacts, dentures or bridgework may not be worn into surgery.                Please read over the following fact sheets you were given: _____________________________________________________________________           [facility] - Preparing for Surgery Before surgery, you can play an important role.  Because skin is not sterile, your skin needs to be as free of germs as possible.  You can reduce the number of germs on your skin by washing with CHG (chlorahexidine gluconate) soap before surgery.  CHG is an antiseptic cleaner which kills germs and bonds with the skin to continue killing germs even after washing. Please DO NOT use if you have an allergy to CHG or antibacterial soaps.  If your skin becomes reddened/irritated stop using the CHG and inform your nurse when you arrive at Short Stay. Do not shave (including legs and underarms) for at least 48 hours prior to  the first CHG shower.  You may shave your face/neck. Please follow these instructions carefully:  1.  Shower with CHG Soap the night before surgery and the  morning of  Surgery.  2.  If you choose to wash your hair, wash your hair first as usual with your  normal  shampoo.  3.  After you shampoo, rinse your hair and body thoroughly to remove the  shampoo.                           4.  Use CHG as you would any other liquid soap.  You can apply chg directly  to the skin and wash                       Gently with a scrungie or clean washcloth.  5.  Apply the CHG Soap to your body ONLY FROM THE NECK DOWN.   Do not use on face/ open                           Wound or open sores. Avoid contact with eyes, ears mouth and genitals (private parts).                       Wash face,  Genitals (private parts) with your normal soap.             6.  Wash thoroughly, paying special attention to the area where your surgery  will be performed.  7.  Thoroughly rinse your body with warm water from the neck down.  8.  DO NOT shower/wash with your normal soap after using and rinsing off  the CHG Soap.                9.  Pat yourself dry with a clean towel.            10.  Wear clean pajamas.            11.  Place clean sheets on your bed the night of your first shower and do not  sleep with pets. Day of Surgery : Do not apply any lotions/deodorants the morning of surgery.  Please wear clean clothes to the hospital/surgery center.  FAILURE TO FOLLOW THESE INSTRUCTIONS MAY RESULT IN THE CANCELLATION OF YOUR SURGERY PATIENT SIGNATURE_________________________________  NURSE SIGNATURE__________________________________  ________________________________________________________________________   Rogelia MireIncentive Spirometer  An incentive spirometer is a tool that can help keep your lungs clear and active. This tool measures how well you are filling your lungs with each breath. Taking long deep breaths may help reverse or decrease the chance of developing breathing (pulmonary) problems (especially infection) following:  A long period of time when you are unable to move or be active. BEFORE  THE PROCEDURE   If the spirometer includes an indicator to show your best effort, your nurse or respiratory therapist will set it to a desired goal.  If possible, sit up straight or lean slightly forward. Try not to slouch.  Hold the incentive spirometer in an upright position. INSTRUCTIONS FOR USE  1. Sit on the edge of your bed if possible, or sit up as far as you can in bed or on a chair. 2. Hold the incentive spirometer in an upright position. 3. Breathe out normally. 4. Place the mouthpiece in your mouth and seal your lips tightly around it. 5. Breathe in slowly  and as deeply as possible, raising the piston or the ball toward the top of the column. 6. Hold your breath for 3-5 seconds or for as long as possible. Allow the piston or ball to fall to the bottom of the column. 7. Remove the mouthpiece from your mouth and breathe out normally. 8. Rest for a few seconds and repeat Steps 1 through 7 at least 10 times every 1-2 hours when you are awake. Take your time and take a few normal breaths between deep breaths. 9. The spirometer may include an indicator to show your best effort. Use the indicator as a goal to work toward during each repetition. 10. After each set of 10 deep breaths, practice coughing to be sure your lungs are clear. If you have an incision (the cut made at the time of surgery), support your incision when coughing by placing a pillow or rolled up towels firmly against it. Once you are able to get out of bed, walk around indoors and cough well. You may stop using the incentive spirometer when instructed by your caregiver.  RISKS AND COMPLICATIONS  Take your time so you do not get dizzy or light-headed.  If you are in pain, you may need to take or ask for pain medication before doing incentive spirometry. It is harder to take a deep breath if you are having pain. AFTER USE  Rest and breathe slowly and easily.  It can be helpful to keep track of a log of your progress.  Your caregiver can provide you with a simple table to help with this. If you are using the spirometer at home, follow these instructions: SEEK MEDICAL CARE IF:   You are having difficultly using the spirometer.  You have trouble using the spirometer as often as instructed.  Your pain medication is not giving enough relief while using the spirometer.  You develop fever of 100.5 F (38.1 C) or higher. SEEK IMMEDIATE MEDICAL CARE IF:   You cough up bloody sputum that had not been present before.  You develop fever of 102 F (38.9 C) or greater.  You develop worsening pain at or near the incision site. MAKE SURE YOU:   Understand these instructions.  Will watch your condition.  Will get help right away if you are not doing well or get worse. Document Released: 11/05/2006 Document Revised: 09/17/2011 Document Reviewed: 01/06/2007 ExitCare Patient Information 2014 ExitCare, [place].   ________________________________________________________________________  WHAT IS A BLOOD TRANSFUSION? Blood Transfusion Information  A transfusion is the replacement of blood or some of its parts. Blood is made up of multiple cells which provide different functions.  Red blood cells carry oxygen and are used for blood loss replacement.  White blood cells fight against infection.  Platelets control bleeding.  Plasma helps clot blood.  Other blood products are available for specialized needs, such as hemophilia or other clotting disorders. BEFORE THE TRANSFUSION  Who gives blood for transfusions?   Healthy volunteers who are fully evaluated to make sure their blood is safe. This is blood bank blood. Transfusion therapy is the safest it has ever been in the practice of medicine. Before blood is taken from a donor, a complete history is taken to make sure that person has no history of diseases nor engages in risky social behavior (examples are intravenous drug use or sexual activity with multiple  partners). The donor's travel history is screened to minimize risk of transmitting infections, such as malaria. The donated blood is tested for signs  of infectious diseases, such as HIV and hepatitis. The blood is then tested to be sure it is compatible with you in order to minimize the chance of a transfusion reaction. If you or a relative donates blood, this is often done in anticipation of surgery and is not appropriate for emergency situations. It takes many days to process the donated blood. RISKS AND COMPLICATIONS Although transfusion therapy is very safe and saves many lives, the main dangers of transfusion include:   Getting an infectious disease.  Developing a transfusion reaction. This is an allergic reaction to something in the blood you were given. Every precaution is taken to prevent this. The decision to have a blood transfusion has been considered carefully by your caregiver before blood is given. Blood is not given unless the benefits outweigh the risks. AFTER THE TRANSFUSION  Right after receiving a blood transfusion, you will usually feel much better and more energetic. This is especially true if your red blood cells have gotten low (anemic). The transfusion raises the level of the red blood cells which carry oxygen, and this usually causes an energy increase.  The nurse administering the transfusion will monitor you carefully for complications. HOME CARE INSTRUCTIONS  No special instructions are needed after a transfusion. You may find your energy is better. Speak with your caregiver about any limitations on activity for underlying diseases you may have. SEEK MEDICAL CARE IF:   Your condition is not improving after your transfusion.  You develop redness or irritation at the intravenous (IV) site. SEEK IMMEDIATE MEDICAL CARE IF:  Any of the following symptoms occur over the next 12 hours:  Shaking chills.  You have a temperature by mouth above 102 F (38.9 C), not  controlled by medicine.  Chest, back, or muscle pain.  People around you feel you are not acting correctly or are confused.  Shortness of breath or difficulty breathing.  Dizziness and fainting.  You get a rash or develop hives.  You have a decrease in urine output.  Your urine turns a dark color or changes to pink, red, or brown. Any of the following symptoms occur over the next 10 days:  You have a temperature by mouth above 102 F (38.9 C), not controlled by medicine.  Shortness of breath.  Weakness after normal activity.  The white part of the eye turns yellow (jaundice).  You have a decrease in the amount of urine or are urinating less often.  Your urine turns a dark color or changes to pink, red, or brown. Document Released: 06/22/2000 Document Revised: 09/17/2011 Document Reviewed: 02/09/2008 [facility] Patient Information 2014 [place], [place].  _______________________________________________________________________

## 2019-03-27 ENCOUNTER — Other Ambulatory Visit (HOSPITAL_COMMUNITY)
Admission: RE | Admit: 2019-03-27 | Discharge: 2019-03-27 | Disposition: A | Discharge status: Home / Self Care | Payer: No Typology Code available for payment source | Source: Ambulatory Visit | Attending: Orthopedic Surgery | Admitting: Orthopedic Surgery

## 2019-03-27 ENCOUNTER — Encounter (HOSPITAL_COMMUNITY)
Admission: RE | Admit: 2019-03-27 | Discharge: 2019-03-27 | Disposition: A | Discharge status: Home / Self Care | Payer: No Typology Code available for payment source | Source: Ambulatory Visit | Attending: Orthopedic Surgery | Admitting: Orthopedic Surgery

## 2019-03-27 ENCOUNTER — Other Ambulatory Visit: Payer: Self-pay

## 2019-03-27 ENCOUNTER — Encounter (HOSPITAL_COMMUNITY): Payer: Self-pay

## 2019-03-27 DIAGNOSIS — M1612 Unilateral primary osteoarthritis, left hip: Secondary | ICD-10-CM | POA: Diagnosis not present

## 2019-03-27 DIAGNOSIS — Z01818 Encounter for other preprocedural examination: Secondary | ICD-10-CM | POA: Insufficient documentation

## 2019-03-27 DIAGNOSIS — Z20828 Contact with and (suspected) exposure to other viral communicable diseases: Secondary | ICD-10-CM | POA: Insufficient documentation

## 2019-03-27 DIAGNOSIS — M25552 Pain in left hip: Secondary | ICD-10-CM | POA: Diagnosis not present

## 2019-03-27 HISTORY — DX: Anxiety disorder, unspecified: F41.9

## 2019-03-27 HISTORY — DX: Personal history of urinary calculi: Z87.442

## 2019-03-27 HISTORY — DX: Unspecified osteoarthritis, unspecified site: M19.90

## 2019-03-27 LAB — BASIC METABOLIC PANEL
Anion gap: 8 (ref 5–15)
BUN: 15 mg/dL (ref 6–20)
CO2: 28 mmol/L (ref 22–32)
Calcium: 9.5 mg/dL (ref 8.9–10.3)
Chloride: 108 mmol/L (ref 98–111)
Creatinine, Ser: 1.57 mg/dL — ABNORMAL HIGH (ref 0.61–1.24)
GFR calc Af Amer: 60 mL/min — ABNORMAL LOW (ref >60)
GFR calc non Af Amer: 52 mL/min — ABNORMAL LOW (ref >60)
Glucose, Bld: 92 mg/dL (ref 70–99)
Potassium: 3.7 mmol/L (ref 3.5–5.1)
Sodium: 144 mmol/L (ref 135–145)

## 2019-03-27 LAB — CBC
HCT: 48.6 % (ref 39.0–52.0)
Hemoglobin: 16 g/dL (ref 13.0–17.0)
MCH: 30.1 pg (ref 26.0–34.0)
MCHC: 32.9 g/dL (ref 30.0–36.0)
MCV: 91.4 fL (ref 80.0–100.0)
Platelets: 187 10*3/uL (ref 150–400)
RBC: 5.32 MIL/uL (ref 4.22–5.81)
RDW: 12.6 % (ref 11.5–15.5)
WBC: 6 10*3/uL (ref 4.0–10.5)
nRBC: 0 % (ref 0.0–0.2)

## 2019-03-27 LAB — SURGICAL PCR SCREEN
MRSA, PCR: NEGATIVE
Staphylococcus aureus: NEGATIVE

## 2019-03-28 LAB — NOVEL CORONAVIRUS, NAA (HOSP ORDER, SEND-OUT TO REF LAB; TAT 18-24 HRS): SARS-CoV-2, NAA: NOT DETECTED

## 2019-03-28 LAB — ABO/RH: ABO/RH(D): O POS

## 2019-03-31 ENCOUNTER — Inpatient Hospital Stay (HOSPITAL_COMMUNITY): Payer: No Typology Code available for payment source

## 2019-03-31 ENCOUNTER — Other Ambulatory Visit: Payer: Self-pay

## 2019-03-31 ENCOUNTER — Inpatient Hospital Stay (HOSPITAL_COMMUNITY)
Admission: RE | Admit: 2019-03-31 | Discharge: 2019-04-01 | DRG: 470 | Disposition: A | Discharge status: Home / Self Care | Payer: No Typology Code available for payment source | Attending: Orthopedic Surgery | Admitting: Orthopedic Surgery

## 2019-03-31 ENCOUNTER — Inpatient Hospital Stay (HOSPITAL_COMMUNITY): Payer: No Typology Code available for payment source | Admitting: Physician Assistant

## 2019-03-31 ENCOUNTER — Encounter (HOSPITAL_COMMUNITY)
Admission: RE | Disposition: A | Discharge status: Home / Self Care | Payer: Self-pay | Source: Home / Self Care | Attending: Orthopedic Surgery

## 2019-03-31 ENCOUNTER — Encounter (HOSPITAL_COMMUNITY): Payer: Self-pay | Admitting: *Deleted

## 2019-03-31 ENCOUNTER — Inpatient Hospital Stay (HOSPITAL_COMMUNITY): Payer: No Typology Code available for payment source | Admitting: Registered Nurse

## 2019-03-31 DIAGNOSIS — Z809 Family history of malignant neoplasm, unspecified: Secondary | ICD-10-CM

## 2019-03-31 DIAGNOSIS — M1612 Unilateral primary osteoarthritis, left hip: Principal | ICD-10-CM | POA: Diagnosis present

## 2019-03-31 DIAGNOSIS — N182 Chronic kidney disease, stage 2 (mild): Secondary | ICD-10-CM | POA: Diagnosis present

## 2019-03-31 DIAGNOSIS — J45909 Unspecified asthma, uncomplicated: Secondary | ICD-10-CM | POA: Diagnosis present

## 2019-03-31 DIAGNOSIS — M25552 Pain in left hip: Secondary | ICD-10-CM | POA: Diagnosis present

## 2019-03-31 DIAGNOSIS — E669 Obesity, unspecified: Secondary | ICD-10-CM | POA: Diagnosis present

## 2019-03-31 DIAGNOSIS — Z8249 Family history of ischemic heart disease and other diseases of the circulatory system: Secondary | ICD-10-CM | POA: Diagnosis not present

## 2019-03-31 DIAGNOSIS — Z96649 Presence of unspecified artificial hip joint: Secondary | ICD-10-CM

## 2019-03-31 DIAGNOSIS — Z96642 Presence of left artificial hip joint: Secondary | ICD-10-CM

## 2019-03-31 DIAGNOSIS — Z8349 Family history of other endocrine, nutritional and metabolic diseases: Secondary | ICD-10-CM | POA: Diagnosis not present

## 2019-03-31 DIAGNOSIS — Z823 Family history of stroke: Secondary | ICD-10-CM

## 2019-03-31 DIAGNOSIS — Z7951 Long term (current) use of inhaled steroids: Secondary | ICD-10-CM | POA: Diagnosis not present

## 2019-03-31 DIAGNOSIS — Z6833 Body mass index (BMI) 33.0-33.9, adult: Secondary | ICD-10-CM | POA: Diagnosis not present

## 2019-03-31 DIAGNOSIS — Z419 Encounter for procedure for purposes other than remedying health state, unspecified: Secondary | ICD-10-CM

## 2019-03-31 HISTORY — PX: TOTAL HIP ARTHROPLASTY: SHX124

## 2019-03-31 LAB — TYPE AND SCREEN
ABO/RH(D): O POS
Antibody Screen: NEGATIVE

## 2019-03-31 SURGERY — ARTHROPLASTY, HIP, TOTAL, ANTERIOR APPROACH
Anesthesia: General | Site: Hip | Laterality: Left

## 2019-03-31 MED ORDER — FENTANYL CITRATE (PF) 100 MCG/2ML IJ SOLN
INTRAMUSCULAR | Status: DC | PRN
  Administered 2019-03-31: 50 ug via INTRAVENOUS
  Administered 2019-03-31: 100 ug via INTRAVENOUS
  Administered 2019-03-31: 50 ug via INTRAVENOUS

## 2019-03-31 MED ORDER — MENTHOL 3 MG MT LOZG: 1.0000 | LOZENGE | OROMUCOSAL | Status: DC | PRN

## 2019-03-31 MED ORDER — DEXMEDETOMIDINE HCL IN NACL 200 MCG/50ML IV SOLN
INTRAVENOUS | Status: AC
  Filled 2019-03-31: qty 50

## 2019-03-31 MED ORDER — EPHEDRINE SULFATE-NACL 50-0.9 MG/10ML-% IV SOSY
PREFILLED_SYRINGE | INTRAVENOUS | Status: DC | PRN
  Administered 2019-03-31: 5 mg via INTRAVENOUS

## 2019-03-31 MED ORDER — ALBUTEROL SULFATE (2.5 MG/3ML) 0.083% IN NEBU: 3.0000 mL | INHALATION_SOLUTION | RESPIRATORY_TRACT | Status: DC | PRN

## 2019-03-31 MED ORDER — LACTATED RINGERS IV SOLN: INTRAVENOUS | Status: DC

## 2019-03-31 MED ORDER — FENTANYL CITRATE (PF) 100 MCG/2ML IJ SOLN
INTRAMUSCULAR | Status: AC
  Filled 2019-03-31: qty 2

## 2019-03-31 MED ORDER — ONDANSETRON HCL 4 MG/2ML IJ SOLN
INTRAMUSCULAR | Status: DC | PRN
  Administered 2019-03-31: 4 mg via INTRAVENOUS

## 2019-03-31 MED ORDER — SODIUM CHLORIDE 0.9 % IR SOLN
Status: DC | PRN
  Administered 2019-03-31 (×2): 1000 mL

## 2019-03-31 MED ORDER — CEFAZOLIN SODIUM-DEXTROSE 2-4 GM/100ML-% IV SOLN
2.0000 g | INTRAVENOUS | Status: AC
  Administered 2019-03-31: 07:00:00 2 g via INTRAVENOUS
  Filled 2019-03-31: qty 100

## 2019-03-31 MED ORDER — LIDOCAINE 2% (20 MG/ML) 5 ML SYRINGE
INTRAMUSCULAR | Status: DC | PRN
  Administered 2019-03-31: 80 mg via INTRAVENOUS

## 2019-03-31 MED ORDER — CEFAZOLIN SODIUM-DEXTROSE 2-4 GM/100ML-% IV SOLN
2.0000 g | Freq: Four times a day (QID) | INTRAVENOUS | Status: AC
  Administered 2019-03-31 (×2): 2 g via INTRAVENOUS
  Filled 2019-03-31 (×2): qty 100

## 2019-03-31 MED ORDER — SUGAMMADEX SODIUM 500 MG/5ML IV SOLN
INTRAVENOUS | Status: DC | PRN
  Administered 2019-03-31: 250 mg via INTRAVENOUS

## 2019-03-31 MED ORDER — METHOCARBAMOL 500 MG PO TABS
500.0000 mg | ORAL_TABLET | Freq: Four times a day (QID) | ORAL | Status: DC | PRN
  Administered 2019-04-01 (×2): 500 mg via ORAL
  Filled 2019-03-31 (×2): qty 1

## 2019-03-31 MED ORDER — EPHEDRINE 5 MG/ML INJ
INTRAVENOUS | Status: AC
  Filled 2019-03-31: qty 10

## 2019-03-31 MED ORDER — METOCLOPRAMIDE HCL 5 MG PO TABS: 5.0000 mg | ORAL_TABLET | Freq: Three times a day (TID) | ORAL | Status: DC | PRN

## 2019-03-31 MED ORDER — POVIDONE-IODINE 10 % EX SWAB
2.0000 "application " | Freq: Once | CUTANEOUS | Status: AC
  Administered 2019-03-31: 2 via TOPICAL

## 2019-03-31 MED ORDER — ONDANSETRON HCL 4 MG/2ML IJ SOLN
4.0000 mg | Freq: Four times a day (QID) | INTRAMUSCULAR | Status: DC | PRN
  Administered 2019-03-31 – 2019-04-01 (×2): 4 mg via INTRAVENOUS
  Filled 2019-03-31 (×2): qty 2

## 2019-03-31 MED ORDER — POLYETHYLENE GLYCOL 3350 17 G PO PACK
17.0000 g | PACK | Freq: Two times a day (BID) | ORAL | Status: DC
  Administered 2019-03-31: 17 g via ORAL
  Filled 2019-03-31 (×2): qty 1

## 2019-03-31 MED ORDER — LACTATED RINGERS IV SOLN
INTRAVENOUS | Status: DC
  Administered 2019-03-31: 06:00:00 via INTRAVENOUS

## 2019-03-31 MED ORDER — HYDROMORPHONE HCL 1 MG/ML IJ SOLN
INTRAMUSCULAR | Status: DC | PRN
  Administered 2019-03-31: 1 mg via INTRAVENOUS

## 2019-03-31 MED ORDER — PROPOFOL 10 MG/ML IV BOLUS
INTRAVENOUS | Status: DC | PRN
  Administered 2019-03-31: 250 mg via INTRAVENOUS

## 2019-03-31 MED ORDER — METOCLOPRAMIDE HCL 5 MG/ML IJ SOLN
INTRAMUSCULAR | Status: AC
  Filled 2019-03-31: qty 2

## 2019-03-31 MED ORDER — DOCUSATE SODIUM 100 MG PO CAPS
100.0000 mg | ORAL_CAPSULE | Freq: Two times a day (BID) | ORAL | Status: DC
  Administered 2019-03-31 – 2019-04-01 (×2): 100 mg via ORAL
  Filled 2019-03-31 (×2): qty 1

## 2019-03-31 MED ORDER — HYDROCODONE-ACETAMINOPHEN 5-325 MG PO TABS
1.0000 | ORAL_TABLET | ORAL | Status: DC | PRN
  Administered 2019-03-31: 1 via ORAL
  Filled 2019-03-31 (×2): qty 1

## 2019-03-31 MED ORDER — MIDAZOLAM HCL 2 MG/2ML IJ SOLN
INTRAMUSCULAR | Status: AC
  Filled 2019-03-31: qty 2

## 2019-03-31 MED ORDER — DEXMEDETOMIDINE HCL IN NACL 200 MCG/50ML IV SOLN
INTRAVENOUS | Status: DC | PRN
  Administered 2019-03-31 (×2): 4 ug via INTRAVENOUS

## 2019-03-31 MED ORDER — EPHEDRINE SULFATE-NACL 50-0.9 MG/10ML-% IV SOSY: PREFILLED_SYRINGE | INTRAVENOUS | Status: DC | PRN

## 2019-03-31 MED ORDER — ACETAMINOPHEN 10 MG/ML IV SOLN
INTRAVENOUS | Status: DC | PRN
  Administered 2019-03-31: 1000 mg via INTRAVENOUS

## 2019-03-31 MED ORDER — DROPERIDOL 2.5 MG/ML IJ SOLN: 0.6250 mg | Freq: Once | INTRAMUSCULAR | Status: DC

## 2019-03-31 MED ORDER — DROPERIDOL 2.5 MG/ML IJ SOLN
INTRAMUSCULAR | Status: AC
  Filled 2019-03-31: qty 2

## 2019-03-31 MED ORDER — HYDROMORPHONE HCL 2 MG/ML IJ SOLN
INTRAMUSCULAR | Status: AC
  Filled 2019-03-31: qty 1

## 2019-03-31 MED ORDER — CELECOXIB 200 MG PO CAPS
200.0000 mg | ORAL_CAPSULE | Freq: Two times a day (BID) | ORAL | Status: DC
  Administered 2019-03-31 – 2019-04-01 (×2): 200 mg via ORAL
  Filled 2019-03-31 (×3): qty 1

## 2019-03-31 MED ORDER — BISACODYL 10 MG RE SUPP: 10.0000 mg | Freq: Every day | RECTAL | Status: DC | PRN

## 2019-03-31 MED ORDER — SODIUM CHLORIDE 0.9 % IV SOLN
INTRAVENOUS | Status: DC
  Administered 2019-03-31 (×2): via INTRAVENOUS

## 2019-03-31 MED ORDER — TRANEXAMIC ACID-NACL 1000-0.7 MG/100ML-% IV SOLN
1000.0000 mg | INTRAVENOUS | Status: AC
  Administered 2019-03-31: 1000 mg via INTRAVENOUS
  Filled 2019-03-31: qty 100

## 2019-03-31 MED ORDER — MAGNESIUM CITRATE PO SOLN: 1.0000 | Freq: Once | ORAL | Status: DC | PRN

## 2019-03-31 MED ORDER — PHENOL 1.4 % MT LIQD: 1.0000 | OROMUCOSAL | Status: DC | PRN

## 2019-03-31 MED ORDER — ALUM & MAG HYDROXIDE-SIMETH 200-200-20 MG/5ML PO SUSP: 15.0000 mL | ORAL | Status: DC | PRN

## 2019-03-31 MED ORDER — HYDROMORPHONE HCL 1 MG/ML IJ SOLN: 0.5000 mg | INTRAMUSCULAR | Status: DC | PRN

## 2019-03-31 MED ORDER — LIDOCAINE 2% (20 MG/ML) 5 ML SYRINGE
INTRAMUSCULAR | Status: AC
  Filled 2019-03-31: qty 5

## 2019-03-31 MED ORDER — FERROUS SULFATE 325 (65 FE) MG PO TABS
325.0000 mg | ORAL_TABLET | Freq: Three times a day (TID) | ORAL | Status: DC
  Administered 2019-03-31 – 2019-04-01 (×2): 325 mg via ORAL
  Filled 2019-03-31 (×2): qty 1

## 2019-03-31 MED ORDER — ONDANSETRON HCL 4 MG PO TABS: 4.0000 mg | ORAL_TABLET | Freq: Four times a day (QID) | ORAL | Status: DC | PRN

## 2019-03-31 MED ORDER — SUGAMMADEX SODIUM 500 MG/5ML IV SOLN
INTRAVENOUS | Status: AC
  Filled 2019-03-31: qty 5

## 2019-03-31 MED ORDER — METOCLOPRAMIDE HCL 5 MG/ML IJ SOLN
10.0000 mg | Freq: Once | INTRAMUSCULAR | Status: AC | PRN
  Administered 2019-03-31: 10:00:00 10 mg via INTRAVENOUS

## 2019-03-31 MED ORDER — ACETAMINOPHEN 325 MG PO TABS: 325.0000 mg | ORAL_TABLET | Freq: Four times a day (QID) | ORAL | Status: DC | PRN

## 2019-03-31 MED ORDER — DIPHENHYDRAMINE HCL 12.5 MG/5ML PO ELIX: 12.5000 mg | ORAL_SOLUTION | ORAL | Status: DC | PRN

## 2019-03-31 MED ORDER — CHLORHEXIDINE GLUCONATE 4 % EX LIQD: 60.0000 mL | Freq: Once | CUTANEOUS | Status: DC

## 2019-03-31 MED ORDER — METOCLOPRAMIDE HCL 5 MG/ML IJ SOLN
5.0000 mg | Freq: Three times a day (TID) | INTRAMUSCULAR | Status: DC | PRN
  Administered 2019-03-31: 10 mg via INTRAVENOUS
  Filled 2019-03-31: qty 2

## 2019-03-31 MED ORDER — METHOCARBAMOL 500 MG IVPB - SIMPLE MED
500.0000 mg | Freq: Four times a day (QID) | INTRAVENOUS | Status: DC | PRN
  Filled 2019-03-31: qty 50

## 2019-03-31 MED ORDER — ASPIRIN 81 MG PO CHEW
81.0000 mg | CHEWABLE_TABLET | Freq: Two times a day (BID) | ORAL | Status: DC
  Administered 2019-03-31 – 2019-04-01 (×2): 81 mg via ORAL
  Filled 2019-03-31: qty 1

## 2019-03-31 MED ORDER — ROCURONIUM BROMIDE 10 MG/ML (PF) SYRINGE
PREFILLED_SYRINGE | INTRAVENOUS | Status: DC | PRN
  Administered 2019-03-31: 80 mg via INTRAVENOUS

## 2019-03-31 MED ORDER — ROCURONIUM BROMIDE 10 MG/ML (PF) SYRINGE
PREFILLED_SYRINGE | INTRAVENOUS | Status: AC
  Filled 2019-03-31: qty 10

## 2019-03-31 MED ORDER — ONDANSETRON HCL 4 MG/2ML IJ SOLN
INTRAMUSCULAR | Status: AC
  Filled 2019-03-31: qty 2

## 2019-03-31 MED ORDER — DEXAMETHASONE SODIUM PHOSPHATE 10 MG/ML IJ SOLN
10.0000 mg | Freq: Once | INTRAMUSCULAR | Status: AC
  Administered 2019-03-31: 07:00:00 8 mg via INTRAVENOUS

## 2019-03-31 MED ORDER — ALBUTEROL SULFATE (2.5 MG/3ML) 0.083% IN NEBU: 2.5000 mg | INHALATION_SOLUTION | Freq: Four times a day (QID) | RESPIRATORY_TRACT | Status: DC | PRN

## 2019-03-31 MED ORDER — FENTANYL CITRATE (PF) 100 MCG/2ML IJ SOLN: 25.0000 ug | INTRAMUSCULAR | Status: DC | PRN

## 2019-03-31 MED ORDER — MIDAZOLAM HCL 5 MG/5ML IJ SOLN
INTRAMUSCULAR | Status: DC | PRN
  Administered 2019-03-31: 2 mg via INTRAVENOUS

## 2019-03-31 MED ORDER — MEPERIDINE HCL 50 MG/ML IJ SOLN: 6.2500 mg | INTRAMUSCULAR | Status: DC | PRN

## 2019-03-31 MED ORDER — TRANEXAMIC ACID-NACL 1000-0.7 MG/100ML-% IV SOLN
1000.0000 mg | Freq: Once | INTRAVENOUS | Status: AC
  Administered 2019-03-31: 12:00:00 1000 mg via INTRAVENOUS
  Filled 2019-03-31: qty 100

## 2019-03-31 MED ORDER — HYDROCODONE-ACETAMINOPHEN 7.5-325 MG PO TABS
1.0000 | ORAL_TABLET | ORAL | Status: DC | PRN
  Administered 2019-03-31 – 2019-04-01 (×3): 1 via ORAL
  Filled 2019-03-31 (×3): qty 1

## 2019-03-31 MED ORDER — ACETAMINOPHEN 10 MG/ML IV SOLN
INTRAVENOUS | Status: AC
  Filled 2019-03-31: qty 100

## 2019-03-31 MED ORDER — DEXAMETHASONE SODIUM PHOSPHATE 10 MG/ML IJ SOLN
10.0000 mg | Freq: Once | INTRAMUSCULAR | Status: AC
  Administered 2019-04-01: 10:00:00 10 mg via INTRAVENOUS
  Filled 2019-03-31: qty 1

## 2019-03-31 SURGICAL SUPPLY — 47 items
BAG DECANTER FOR FLEXI CONT (MISCELLANEOUS) IMPLANT
BAG ZIPLOCK 12X15 (MISCELLANEOUS) IMPLANT
BLADE SAG 18X100X1.27 (BLADE) ×3 IMPLANT
BLADE SURG SZ10 CARB STEEL (BLADE) ×6 IMPLANT
COVER PERINEAL POST (MISCELLANEOUS) ×3 IMPLANT
COVER SURGICAL LIGHT HANDLE (MISCELLANEOUS) ×3 IMPLANT
COVER WAND RF STERILE (DRAPES) IMPLANT
CUP ACETBLR 54 OD PINNACLE (Hips) ×3 IMPLANT
DERMABOND ADVANCED (GAUZE/BANDAGES/DRESSINGS) ×2
DERMABOND ADVANCED .7 DNX12 (GAUZE/BANDAGES/DRESSINGS) ×1 IMPLANT
DRAPE STERI IOBAN 125X83 (DRAPES) ×3 IMPLANT
DRAPE U-SHAPE 47X51 STRL (DRAPES) ×6 IMPLANT
DRESSING AQUACEL AG SP 3.5X10 (GAUZE/BANDAGES/DRESSINGS) ×1 IMPLANT
DRSG AQUACEL AG SP 3.5X10 (GAUZE/BANDAGES/DRESSINGS) ×3
DURAPREP 26ML APPLICATOR (WOUND CARE) ×3 IMPLANT
ELECT BLADE TIP CTD 4 INCH (ELECTRODE) ×3 IMPLANT
ELECT REM PT RETURN 15FT ADLT (MISCELLANEOUS) ×3 IMPLANT
ELIMINATOR HOLE APEX DEPUY (Hips) ×3 IMPLANT
GLOVE BIO SURGEON STRL SZ 6 (GLOVE) ×6 IMPLANT
GLOVE BIOGEL PI IND STRL 6.5 (GLOVE) ×1 IMPLANT
GLOVE BIOGEL PI IND STRL 7.5 (GLOVE) ×1 IMPLANT
GLOVE BIOGEL PI IND STRL 8.5 (GLOVE) ×1 IMPLANT
GLOVE BIOGEL PI INDICATOR 6.5 (GLOVE) ×2
GLOVE BIOGEL PI INDICATOR 7.5 (GLOVE) ×2
GLOVE BIOGEL PI INDICATOR 8.5 (GLOVE) ×2
GLOVE ECLIPSE 8.0 STRL XLNG CF (GLOVE) ×6 IMPLANT
GLOVE ORTHO TXT STRL SZ7.5 (GLOVE) ×6 IMPLANT
GOWN STRL REUS W/TWL LRG LVL3 (GOWN DISPOSABLE) ×6 IMPLANT
GOWN STRL REUS W/TWL XL LVL3 (GOWN DISPOSABLE) ×3 IMPLANT
HEAD CERAMIC DELTA 36 PLUS 1.5 (Hips) ×3 IMPLANT
HOLDER FOLEY CATH W/STRAP (MISCELLANEOUS) IMPLANT
IRRIG SUCT STRYKERFLOW 2 WTIP (MISCELLANEOUS) ×3
IRRIGATION SUCT STRKRFLW 2 WTP (MISCELLANEOUS) ×1 IMPLANT
KIT TURNOVER KIT A (KITS) IMPLANT
LINER NEUTRAL 54X36MM PLUS 4 (Hips) ×3 IMPLANT
PACK ANTERIOR HIP CUSTOM (KITS) ×3 IMPLANT
SCREW 6.5MMX25MM (Screw) ×3 IMPLANT
STEM FEM ACTIS HIGH SZ8 (Stem) ×3 IMPLANT
SUT MNCRL AB 4-0 PS2 18 (SUTURE) ×3 IMPLANT
SUT STRATAFIX 0 PDS 27 VIOLET (SUTURE) ×3
SUT VIC AB 1 CT1 36 (SUTURE) ×9 IMPLANT
SUT VIC AB 2-0 CT1 27 (SUTURE) ×4
SUT VIC AB 2-0 CT1 TAPERPNT 27 (SUTURE) ×2 IMPLANT
SUTURE STRATFX 0 PDS 27 VIOLET (SUTURE) ×1 IMPLANT
TRAY FOLEY MTR SLVR 16FR STAT (SET/KITS/TRAYS/PACK) IMPLANT
WATER STERILE IRR 1000ML POUR (IV SOLUTION) ×6 IMPLANT
YANKAUER SUCT BULB TIP 10FT TU (MISCELLANEOUS) IMPLANT

## 2019-03-31 NOTE — Transfer of Care (Signed)
Immediate Anesthesia Transfer of Care Note  Patient: Chris Williams  Procedure(s) Performed: TOTAL HIP ARTHROPLASTY ANTERIOR APPROACH (Left Hip)  Patient Location: PACU  Anesthesia Type:General  Level of Consciousness: awake, alert , oriented and patient cooperative  Airway & Oxygen Therapy: Patient Spontanous Breathing and Patient connected to face mask oxygen  Post-op Assessment: Report given to RN, Post -op Vital signs reviewed and stable and Patient moving all extremities  Post vital signs: Reviewed and stable  Last Vitals:  Vitals Value Taken Time  BP 140/96 03/31/19 0909  Temp    Pulse 90 03/31/19 0909  Resp 30 03/31/19 0909  SpO2 99 % 03/31/19 0909  Vitals shown include unvalidated device data.  Last Pain:  Vitals:   03/31/19 0623  TempSrc: Oral      Patients Stated Pain Goal: 3 (07/37/10 6269)  Complications: No apparent anesthesia complications

## 2019-03-31 NOTE — Anesthesia Postprocedure Evaluation (Signed)
Anesthesia Post Note  Patient: Chris Williams  Procedure(s) Performed: TOTAL HIP ARTHROPLASTY ANTERIOR APPROACH (Left Hip)     Patient location during evaluation: PACU Anesthesia Type: General Level of consciousness: awake and alert Pain management: pain level controlled Vital Signs Assessment: post-procedure vital signs reviewed and stable Respiratory status: spontaneous breathing and respiratory function stable Cardiovascular status: blood pressure returned to baseline and stable Postop Assessment: no headache, no backache and no apparent nausea or vomiting Anesthetic complications: no    Last Vitals:  Vitals:   03/31/19 1258 03/31/19 1401  BP: (!) 111/59 120/81  Pulse: 69 75  Resp: 16 16  Temp: (!) 36.2 C 36.9 C  SpO2: 100% 100%    Last Pain:  Vitals:   03/31/19 1504  TempSrc:   PainSc: 7                  Montez Hageman

## 2019-03-31 NOTE — Progress Notes (Signed)
Patient complaining of pain. Tried to give PRN norco and scheduled celebrex but patient started complaining of nausea and did not want to take anything for pain. Norco and Celebrex wasted in pyxis and stericycle. Will continue to monitor.

## 2019-03-31 NOTE — Progress Notes (Signed)
PT Cancellation Note  Patient Details Name: ENRIQUE MANGANARO MRN: [id number] DOB: Nov 28, 1970   Cancelled Treatment:    Reason Eval/Treat Not Completed: Medical issues which prohibited therapy(pt declined PT 2* nausea. RN notifed of pt request for anti-nausea medication. Will follow.)  Philomena Doheny PT 03/31/2019  Acute Rehabilitation Services Pager [phone] Office [phone]

## 2019-03-31 NOTE — Discharge Instructions (Signed)

## 2019-03-31 NOTE — Interval H&P Note (Signed)
History and Physical Interval Note:  03/31/2019 7:04 AM  [name]  has presented today for surgery, with the diagnosis of Left hip osteoarthritis.  The various methods of treatment have been discussed with the patient and family. After consideration of risks, benefits and other options for treatment, the patient has consented to  Procedure(s) with comments: TOTAL HIP ARTHROPLASTY ANTERIOR APPROACH (Left) - 70 mins as a surgical intervention.  The patient's history has been reviewed, patient examined, no change in status, stable for surgery.  I have reviewed the patient's chart and labs.  Questions were answered to the patient's satisfaction.     Chris Williams

## 2019-03-31 NOTE — Anesthesia Procedure Notes (Addendum)

## 2019-03-31 NOTE — Op Note (Addendum)
NAME:  Chris Williams                ACCOUNT NO.: [id number]      MEDICAL RECORD NO.: [id number]      FACILITY:  [facility]      PHYSICIAN:  Shelda Pal  DATE OF BIRTH:  1971/05/10     DATE OF PROCEDURE:  03/31/2019                                 OPERATIVE REPORT         PREOPERATIVE DIAGNOSIS: Left  hip osteoarthritis.      POSTOPERATIVE DIAGNOSIS:  Left hip osteoarthritis.      PROCEDURE:  Left total hip replacement through an anterior approach   utilizing DePuy THR system, component size 74mm pinnacle cup, a size 36+4 neutral   Altrex liner, a size 8 Hi Actis stem with a 36+1.5 delta ceramic   ball.      SURGEON:  Madlyn Frankel. Charlann Boxer, M.D.      ASSISTANT:  Lanney Gins, PA-C     ANESTHESIA:  General.      SPECIMENS:  None.      COMPLICATIONS:  None.      BLOOD LOSS:  250 cc     DRAINS:  None.      INDICATION OF THE PROCEDURE:  Chris Williams is a 48 y.o. male who had   presented to office for evaluation of left hip pain.  Radiographs revealed   progressive degenerative changes with bone-on-bone   articulation of the  hip joint, including subchondral cystic changes and osteophytes.  The patient had painful limited range of   motion significantly affecting their overall quality of life and function.  The patient was failing to    respond to conservative measures including medications and/or injections and activity modification and at this point was ready   to proceed with more definitive measures.  Consent was obtained for   benefit of pain relief.  Specific risks of infection, DVT, component   failure, dislocation, neurovascular injury, and need for revision surgery were reviewed in the office as well discussion of   the anterior versus posterior approach were reviewed.     PROCEDURE IN DETAIL:  The patient was brought to operative theater.   Once adequate anesthesia, preoperative antibiotics, 2 gm of Ancef, 1 gm of Tranexamic Acid, and 10 mg of  Decadron were administered, the patient was positioned supine on the [employer] table.  Once the patient was safely positioned with adequate padding of boney prominences we predraped out the hip, and used fluoroscopy to confirm orientation of the pelvis.      The left hip was then prepped and draped from proximal iliac crest to   mid thigh with a shower curtain technique.      Time-out was performed identifying the patient, planned procedure, and the appropriate extremity.     An incision was then made 2 cm lateral to the   anterior superior iliac spine extending over the orientation of the   tensor fascia lata muscle and sharp dissection was carried down to the   fascia of the muscle.      The fascia was then incised.  The muscle belly was identified and swept   laterally and retractor placed along the superior neck.  Following   cauterization of the circumflex vessels and removing some pericapsular  fat, a second cobra retractor was placed on the inferior neck.  A T-capsulotomy was made along the line of the   superior neck to the trochanteric fossa, then extended proximally and   distally.  Tag sutures were placed and the retractors were then placed   intracapsular.  We then identified the trochanteric fossa and   orientation of my neck cut and then made a neck osteotomy with the femur on traction.  The femoral   head was removed without difficulty or complication.  Traction was let   off and retractors were placed posterior and anterior around the   acetabulum.      The labrum and foveal tissue were debrided.  I began reaming with a 48 mm   reamer and reamed up to 48mm reamer with good bony bed preparation and a 54 mm  cup was chosen.  The final 54 mm Pinnacle cup was then impacted under fluoroscopy to confirm the depth of penetration and orientation with respect to   Abduction and forward flexion.  A screw was placed into the ilium followed by the hole eliminator.  The final   36+4  neutral Altrex liner was impacted with good visualized rim fit.  The cup was positioned anatomically within the acetabular portion of the pelvis.      At this point, the femur was rolled to 100 degrees.  Further capsule was   released off the inferior aspect of the femoral neck.  I then   released the superior capsule proximally.  With the leg in a neutral position the hook was placed laterally   along the femur under the vastus lateralis origin and elevated manually and then held in position using the hook attachment on the bed.  The leg was then extended and adducted with the leg rolled to 100   degrees of external rotation.  Retractors were placed along the medial calcar and posteriorly over the greater trochanter.  Once the proximal femur was fully   exposed, I used a box osteotome to set orientation.  I then began   broaching with the starting chili pepper broach and passed this by hand and then broached up to 8.  With the 8 broach in place I chose a high offset neck and did several trial reductions.  The offset was appropriate, leg lengths   appeared to be equal best matched with the +1.5 head ball trial confirmed radiographically.   Given these findings, I went ahead and dislocated the hip, repositioned all   retractors and positioned the right hip in the extended and abducted position.  The final 8 Hi Actis stem was   chosen and it was impacted down to the level of neck cut.  Based on this   and the trial reductions, a final 36+1.5 delta ceramic ball was chosen and   impacted onto a clean and dry trunnion, and the hip was reduced.  The   hip had been irrigated throughout the case again at this point.  I did   reapproximate the superior capsular leaflet to the anterior leaflet   using #1 Vicryl.  The fascia of the   tensor fascia lata muscle was then reapproximated using #1 Vicryl and #0 Stratafix sutures.  The   remaining wound was closed with 2-0 Vicryl and running 4-0 Monocryl.   The  hip was cleaned, dried, and dressed sterilely using Dermabond and   Aquacel dressing.  The patient was then brought   to recovery room in stable  condition tolerating the procedure well.    Danae Orleans, PA-C was present for the entirety of the case involved from   preoperative positioning, perioperative retractor management, general   facilitation of the case, as well as primary wound closure as assistant.            Pietro Cassis Alvan Dame, M.D.        03/31/2019 8:37 AM

## 2019-03-31 NOTE — Anesthesia Preprocedure Evaluation (Signed)
Anesthesia Evaluation  Patient identified by MRN, date of birth, ID band Patient awake    Reviewed: Allergy & Precautions, NPO status , Patient's Chart, lab work & pertinent test results  Airway Mallampati: II  TM Distance: >3 FB Neck ROM: Full    Dental no notable dental hx.    Pulmonary neg pulmonary ROS,    Pulmonary exam normal breath sounds clear to auscultation       Cardiovascular negative cardio ROS Normal cardiovascular exam Rhythm:Regular Rate:Normal     Neuro/Psych negative neurological ROS  negative psych ROS   GI/Hepatic negative GI ROS, Neg liver ROS,   Endo/Other  negative endocrine ROS  Renal/GU negative Renal ROS  negative genitourinary   Musculoskeletal negative musculoskeletal ROS (+)   Abdominal   Peds negative pediatric ROS (+)  Hematology negative hematology ROS (+)   Anesthesia Other Findings   Reproductive/Obstetrics negative OB ROS                             Anesthesia Physical Anesthesia Plan  ASA: II  Anesthesia Plan: General   Post-op Pain Management:    Induction: Intravenous  PONV Risk Score and Plan: 3 and Ondansetron, Dexamethasone, Treatment may vary due to age or medical condition and Midazolam  Airway Management Planned: Oral ETT  Additional Equipment:   Intra-op Plan:   Post-operative Plan: Extubation in OR  Informed Consent: I have reviewed the patients History and Physical, chart, labs and discussed the procedure including the risks, benefits and alternatives for the proposed anesthesia with the patient or authorized representative who has indicated his/her understanding and acceptance.     Dental advisory given  Plan Discussed with: CRNA  Anesthesia Plan Comments:         Anesthesia Quick Evaluation  

## 2019-03-31 NOTE — Evaluation (Signed)
Physical Therapy Evaluation Patient Details Name: Chris Williams MRN: [id number] DOB: [birth year] Today's Date: 03/31/2019   History of Present Illness  L DA-THA  Clinical Impression  Pt is s/p THA resulting in the deficits listed below (see PT Problem List). Pt ambulated 75' with RW, no loss of balance. Initiated THA HEP. Good progress expected.  Pt will benefit from skilled PT to increase their independence and safety with mobility to allow discharge to the venue listed below.      Follow Up Recommendations Follow surgeon's recommendation for DC plan and follow-up therapies    Equipment Recommendations  Rolling walker with 5" wheels    Recommendations for Other Services       Precautions / Restrictions Precautions Precautions: Fall Restrictions Weight Bearing Restrictions: No Other Position/Activity Restrictions: WBAT LLE      Mobility  Bed Mobility Overal bed mobility: Needs Assistance Bed Mobility: Supine to Sit     Supine to sit: Min assist     General bed mobility comments: assist to support LLE  Transfers Overall transfer level: Needs assistance Equipment used: Rolling walker (2 wheeled) Transfers: Sit to/from Stand Sit to Stand: Min assist;From elevated surface         General transfer comment: VCs hand placement, assist to rise/steady  Ambulation/Gait Ambulation/Gait assistance: Min guard Gait Distance (Feet): 35 Feet Assistive device: Rolling walker (2 wheeled) Gait Pattern/deviations: Step-to pattern;Decreased step length - right;Decreased step length - left Gait velocity: decr   General Gait Details: steady, no loss of balance, distance limited by pain  Stairs            Wheelchair Mobility    Modified Rankin (Stroke Patients Only)       Balance Overall balance assessment: Modified Independent                                           Pertinent Vitals/Pain Pain Assessment: 0-10 Pain Score: 4  Pain Location: L  hip with walking Pain Descriptors / Indicators: Sore Pain Intervention(s): Limited activity within patient's tolerance;Monitored during session;Premedicated before session;Ice applied    Home Living Family/patient expects to be discharged to:: Private residence Living Arrangements: Spouse/significant other     Home Access: Level entry     Home Layout: One level Home Equipment: Bedside commode;Shower seat      Prior Function Level of Independence: Independent               Hand Dominance        Extremity/Trunk Assessment   Upper Extremity Assessment Upper Extremity Assessment: Overall WFL for tasks assessed    Lower Extremity Assessment Lower Extremity Assessment: LLE deficits/detail LLE Deficits / Details: hip flexion AAROM 40*, hip abduction 10* AAROM, knee ext -3/5 limited by pain LLE Sensation: WNL LLE Coordination: WNL    Cervical / Trunk Assessment Cervical / Trunk Assessment: Normal  Communication   Communication: No difficulties  Cognition Arousal/Alertness: Awake/alert Behavior During Therapy: WFL for tasks assessed/performed Overall Cognitive Status: Within Functional Limits for tasks assessed                                        General Comments      Exercises Total Joint Exercises Ankle Circles/Pumps: AROM;Both;10 reps;Supine Heel Slides: AAROM;Left;10 reps;Supine Hip ABduction/ADduction: AAROM;Left;10 reps;Supine  Assessment/Plan    PT Assessment Patient needs continued PT services  PT Problem List Decreased strength;Decreased range of motion;Decreased activity tolerance;Decreased balance;Pain;Decreased knowledge of use of DME;Decreased mobility       PT Treatment Interventions DME instruction;Gait training;Functional mobility training;Balance training;Therapeutic exercise;Therapeutic activities;Patient/family education    PT Goals (Current goals can be found in the Care Plan section)  Acute Rehab PT Goals Patient  Stated Goal: play with 47 y.o. grandson PT Goal Formulation: With patient/family Time For Goal Achievement: 04/07/19 Potential to Achieve Goals: Good    Frequency 7X/week   Barriers to discharge        Co-evaluation               AM-PAC PT "6 Clicks" Mobility  Outcome Measure Help needed turning from your back to your side while in a flat bed without using bedrails?: A Little Help needed moving from lying on your back to sitting on the side of a flat bed without using bedrails?: A Little Help needed moving to and from a bed to a chair (including a wheelchair)?: A Little Help needed standing up from a chair using your arms (e.g., wheelchair or bedside chair)?: A Little Help needed to walk in hospital room?: A Little Help needed climbing 3-5 steps with a railing? : A Lot 6 Click Score: 17    End of Session Equipment Utilized During Treatment: Gait belt Activity Tolerance: Patient tolerated treatment well Patient left: in chair;with call bell/phone within reach;with family/visitor present Nurse Communication: Mobility status PT Visit Diagnosis: Muscle weakness (generalized) (M62.81);Difficulty in walking, not elsewhere classified (R26.2);Pain Pain - Right/Left: Left Pain - part of body: Hip    Time: 6301-6010 PT Time Calculation (min) (ACUTE ONLY): 18 min   Charges:   PT Evaluation $PT Eval Low Complexity: 1 Low          Blondell Reveal Kistler PT 03/31/2019  Acute Rehabilitation Services Pager [phone] Office [phone]

## 2019-04-01 ENCOUNTER — Encounter (HOSPITAL_COMMUNITY): Payer: Self-pay | Admitting: Orthopedic Surgery

## 2019-04-01 DIAGNOSIS — E669 Obesity, unspecified: Secondary | ICD-10-CM | POA: Diagnosis present

## 2019-04-01 LAB — BASIC METABOLIC PANEL
Anion gap: 9 (ref 5–15)
BUN: 20 mg/dL (ref 6–20)
CO2: 25 mmol/L (ref 22–32)
Calcium: 8.6 mg/dL — ABNORMAL LOW (ref 8.9–10.3)
Chloride: 105 mmol/L (ref 98–111)
Creatinine, Ser: 1.36 mg/dL — ABNORMAL HIGH (ref 0.61–1.24)
GFR calc Af Amer: >60 mL/min (ref >60)
GFR calc non Af Amer: >60 mL/min (ref >60)
Glucose, Bld: 136 mg/dL — ABNORMAL HIGH (ref 70–99)
Potassium: 4.2 mmol/L (ref 3.5–5.1)
Sodium: 139 mmol/L (ref 135–145)

## 2019-04-01 LAB — CBC
HCT: 44.4 % (ref 39.0–52.0)
Hemoglobin: 14.5 g/dL (ref 13.0–17.0)
MCH: 30.3 pg (ref 26.0–34.0)
MCHC: 32.7 g/dL (ref 30.0–36.0)
MCV: 92.7 fL (ref 80.0–100.0)
Platelets: 170 10*3/uL (ref 150–400)
RBC: 4.79 MIL/uL (ref 4.22–5.81)
RDW: 13 % (ref 11.5–15.5)
WBC: 14.3 10*3/uL — ABNORMAL HIGH (ref 4.0–10.5)
nRBC: 0 % (ref 0.0–0.2)

## 2019-04-01 MED ORDER — DOCUSATE SODIUM 100 MG PO CAPS: 100.0000 mg | ORAL_CAPSULE | Freq: Two times a day (BID) | ORAL | 0 refills | Status: AC

## 2019-04-01 MED ORDER — HYDROCODONE-ACETAMINOPHEN 7.5-325 MG PO TABS: 1.0000 | ORAL_TABLET | ORAL | 0 refills | Status: DC | PRN

## 2019-04-01 MED ORDER — FERROUS SULFATE 325 (65 FE) MG PO TABS: 325.0000 mg | ORAL_TABLET | Freq: Three times a day (TID) | ORAL | 0 refills | Status: AC

## 2019-04-01 MED ORDER — POLYETHYLENE GLYCOL 3350 17 G PO PACK: 17.0000 g | PACK | Freq: Two times a day (BID) | ORAL | 0 refills | Status: AC

## 2019-04-01 MED ORDER — ASPIRIN 81 MG PO CHEW: 81.0000 mg | CHEWABLE_TABLET | Freq: Two times a day (BID) | ORAL | 0 refills | Status: AC

## 2019-04-01 MED ORDER — METHOCARBAMOL 500 MG PO TABS: 500.0000 mg | ORAL_TABLET | Freq: Four times a day (QID) | ORAL | 0 refills | Status: AC | PRN

## 2019-04-01 NOTE — Progress Notes (Signed)
Physical Therapy Treatment Patient Details Name: Chris Williams MRN: [id number] DOB: [birth year] Today's Date: 04/01/2019    History of Present Illness L THA-DA    PT Comments    Instructed pt in THA HEP, he demonstrates good understanding. With attempted ambulation pt reported 8/10 pain in L hip and had significant difficulty advancing LLE. Pain medication requested, will return for gait training after pain medication takes effect.    Follow Up Recommendations  Follow surgeon's recommendation for DC plan and follow-up therapies     Equipment Recommendations  Rolling walker with 5" wheels    Recommendations for Other Services       Precautions / Restrictions Precautions Precautions: Fall Restrictions Weight Bearing Restrictions: No Other Position/Activity Restrictions: WBAT LLE    Mobility  Bed Mobility Overal bed mobility: Needs Assistance Bed Mobility: Supine to Sit     Supine to sit: Min assist     General bed mobility comments: assist to support LLE  Transfers Overall transfer level: Needs assistance Equipment used: Rolling walker (2 wheeled) Transfers: Sit to/from Stand Sit to Stand: From elevated surface;Min guard         General transfer comment: VCs hand placement, assist to rise/steady  Ambulation/Gait Ambulation/Gait assistance: Min guard Gait Distance (Feet): 4 Feet Assistive device: Rolling walker (2 wheeled) Gait Pattern/deviations: Step-to pattern;Decreased step length - right;Decreased step length - left Gait velocity: decr   General Gait Details: steady, no loss of balance, distance limited by 8/10 pain   Stairs             Wheelchair Mobility    Modified Rankin (Stroke Patients Only)       Balance Overall balance assessment: Modified Independent                                          Cognition Arousal/Alertness: Awake/alert Behavior During Therapy: WFL for tasks assessed/performed Overall Cognitive  Status: Within Functional Limits for tasks assessed                                        Exercises Total Joint Exercises Ankle Circles/Pumps: AROM;Both;10 reps;Supine Quad Sets: AROM;Left;5 reps;Supine Short Arc Quad: AROM;Left;10 reps;Supine Heel Slides: AAROM;Left;10 reps;Supine Hip ABduction/ADduction: AAROM;Left;10 reps;Supine Long Arc Quad: (unable 2* pain)    General Comments        Pertinent Vitals/Pain Pain Score: 8  Pain Location: L hip with walking Pain Descriptors / Indicators: Sharp Pain Intervention(s): Limited activity within patient's tolerance;Monitored during session;Patient requesting pain meds-RN notified;Ice applied    Home Living                      Prior Function            PT Goals (current goals can now be found in the care plan section) Acute Rehab PT Goals Patient Stated Goal: play with 47 y.o. grandson PT Goal Formulation: With patient/family Time For Goal Achievement: 04/07/19 Potential to Achieve Goals: Good Progress towards PT goals: Progressing toward goals    Frequency    7X/week      PT Plan Current plan remains appropriate    Co-evaluation              AM-PAC PT "6 Clicks" Mobility   Outcome Measure  Help needed turning  from your back to your side while in a flat bed without using bedrails?: None Help needed moving from lying on your back to sitting on the side of a flat bed without using bedrails?: A Little Help needed moving to and from a bed to a chair (including a wheelchair)?: A Little Help needed standing up from a chair using your arms (e.g., wheelchair or bedside chair)?: A Little Help needed to walk in hospital room?: A Little Help needed climbing 3-5 steps with a railing? : A Lot 6 Click Score: 18    End of Session Equipment Utilized During Treatment: Gait belt Activity Tolerance: Patient tolerated treatment well Patient left: in chair;with call bell/phone within reach Nurse  Communication: Mobility status PT Visit Diagnosis: Muscle weakness (generalized) (M62.81);Difficulty in walking, not elsewhere classified (R26.2);Pain Pain - Right/Left: Left Pain - part of body: Hip     Time: 0920-0939 PT Time Calculation (min) (ACUTE ONLY): 19 min  Charges:  $Therapeutic Exercise: 8-22 mins                    Blondell Reveal Kistler PT 04/01/2019  Acute Rehabilitation Services Pager [phone] Office [phone]

## 2019-04-01 NOTE — Progress Notes (Signed)
     Subjective: 1 Day Post-Op Procedure(s) (LRB): TOTAL HIP ARTHROPLASTY ANTERIOR APPROACH (Left)   Seen by Dr. Alvan Dame. Patient reports pain as mild, pain controlled.  No reported events throughout the night.  Patient feels that the hip is doing well as compared to prior to surgery.  Feels that he is progressing well.  Patient is ready be discharged home, if he does well with therapy.  Patient follow-up in the clinic in 2 weeks.  Patient is planning on home exercise program.     Objective:   VITALS:   Vitals:   04/01/19 0141 04/01/19 0546  BP: 123/77 124/72  Pulse: 73 71  Resp: 16 16  Temp: 98.7 F (37.1 C) 98.4 F (36.9 C)  SpO2: 99% 100%    Dorsiflexion/Plantar flexion intact Incision: dressing C/D/I No cellulitis present Compartment soft  LABS Recent Labs    04/01/19 0310  HGB 14.5  HCT 44.4  WBC 14.3*  PLT 170    Recent Labs    04/01/19 0310  NA 139  K 4.2  BUN 20  CREATININE 1.36*  GLUCOSE 136*     Assessment/Plan: 1 Day Post-Op Procedure(s) (LRB): TOTAL HIP ARTHROPLASTY ANTERIOR APPROACH (Left) Foley cath DC'd Advance diet Up with therapy D/C IV fluids Discharge home Follow up in 2 weeks at [facility] Follow up with OLIN,Takisha Pelle D in 2 weeks.  Contact information:  [facility]) [address], [place] [phone]    Obese (BMI 30-39.9) Estimated body mass index is 33.13 kg/m as calculated from the following:   Height as of this encounter: 6\' 2"  (1.88 m).   Weight as of this encounter: 117 kg. Patient also counseled that weight may inhibit the healing process Patient counseled that losing weight will help with future health issues       [place]. [place]   PAC  04/01/2019, 7:51 AM

## 2019-04-01 NOTE — TOC Transition Note (Signed)
Transition of Care [facility]) - CM/SW Discharge Note   Patient Details  Name: Chris Williams MRN: [id number] Date of Birth: Oct 01, 1970  Transition of Care [facility]) CM/SW Contact:  Lia Hopping, [place] Phone Number: 04/01/2019, 11:42 AM   Clinical Narrative:    CSW reached out to Workers Comp agent Anselm Lis [phone]. Workers Tax adviser. Agent requested 3 IN 1 and RW orders. CSW faxed orders to [phone]. She reports the DME orders will have to be approved by the adjustor, then ordered through 3rd party DME company.  The equipment will not be delivered today.  CSW discussed with the charge nurse. The patient will borrow a [employer] rolling walker. The patient agreeable to return to [facility] once his equipment has been delivered.    Final next level of care: Home/Self Care (HEP) Barriers to Discharge: No Barriers Identified   Patient Goals and CMS Choice Patient states their goals for this hospitalization and ongoing recovery are:: to transfer home CMS Medicare.gov Compare Post Acute Care list provided to:: Patient Choice offered to / list presented to : Patient  Discharge Placement  HOME                     Discharge Plan and Services                DME Arranged: (Workers Comp. Agency arranging DME, orderes sent)                    Social Determinants of Health (SDOH) Interventions     Readmission Risk Interventions No flowsheet data found.

## 2019-04-01 NOTE — Progress Notes (Signed)
Physical Therapy Treatment Patient Details Name: Chris Williams MRN: [id number] DOB: April 30, 1971 Today's Date: 04/01/2019    History of Present Illness L DA-THA    PT Comments    Pt ambulated 180' with RW, no loss of balance. Instructed pt in standing exercises from THA HEP. He demonstrates good understanding of HEP.  From PT standpoint, he is ready to DC home.   Follow Up Recommendations  Follow surgeon's recommendation for DC plan and follow-up therapies     Equipment Recommendations  Rolling walker with 5" wheels    Recommendations for Other Services       Precautions / Restrictions Precautions Precautions: Fall Restrictions Weight Bearing Restrictions: No Other Position/Activity Restrictions: WBAT LLE    Mobility  Bed Mobility Overal bed mobility: Needs Assistance Bed Mobility: Supine to Sit     Supine to sit: Min assist     General bed mobility comments: up in recliner  Transfers Overall transfer level: Needs assistance Equipment used: Rolling walker (2 wheeled) Transfers: Sit to/from Stand Sit to Stand: From elevated surface;Supervision         General transfer comment: VCs hand placement  Ambulation/Gait Ambulation/Gait assistance: Supervision Gait Distance (Feet): 180 Feet Assistive device: Rolling walker (2 wheeled) Gait Pattern/deviations: Step-to pattern;Decreased step length - right;Decreased step length - left Gait velocity: decr   General Gait Details: steady, no loss of balance   Stairs             Wheelchair Mobility    Modified Rankin (Stroke Patients Only)       Balance Overall balance assessment: Modified Independent                                          Cognition Arousal/Alertness: Awake/alert Behavior During Therapy: WFL for tasks assessed/performed Overall Cognitive Status: Within Functional Limits for tasks assessed                                        Exercises Total  Joint Exercises Ankle Circles/Pumps: AROM;Both;10 reps;Supine Quad Sets: AROM;Left;5 reps;Supine Short Arc Quad: AROM;Left;10 reps;Supine Heel Slides: AAROM;Left;10 reps;Supine Hip ABduction/ADduction: AROM;Left;5 reps;Standing Long Arc Quad: (unable 2* pain) Knee Flexion: AROM;Left;5 reps;Standing Marching in Standing: AROM;Left;5 reps;Standing Standing Hip Extension: AROM;Left;5 reps;Standing    General Comments        Pertinent Vitals/Pain Pain Score: 6  Pain Location: L hip Pain Descriptors / Indicators: Sore Pain Intervention(s): Limited activity within patient's tolerance;Monitored during session;Premedicated before session;Ice applied    Home Living                      Prior Function            PT Goals (current goals can now be found in the care plan section) Acute Rehab PT Goals Patient Stated Goal: play with 47 y.o. grandson PT Goal Formulation: With patient/family Time For Goal Achievement: 04/07/19 Potential to Achieve Goals: Good Progress towards PT goals: Goals met/education completed, patient discharged from PT    Frequency    7X/week      PT Plan Current plan remains appropriate    Co-evaluation              AM-PAC PT "6 Clicks" Mobility   Outcome Measure  Help needed turning from your back  to your side while in a flat bed without using bedrails?: None Help needed moving from lying on your back to sitting on the side of a flat bed without using bedrails?: A Little Help needed moving to and from a bed to a chair (including a wheelchair)?: None Help needed standing up from a chair using your arms (e.g., wheelchair or bedside chair)?: None Help needed to walk in hospital room?: None Help needed climbing 3-5 steps with a railing? : A Little 6 Click Score: 22    End of Session Equipment Utilized During Treatment: Gait belt Activity Tolerance: Patient tolerated treatment well Patient left: in chair;with call bell/phone within  reach Nurse Communication: Mobility status PT Visit Diagnosis: Muscle weakness (generalized) (M62.81);Difficulty in walking, not elsewhere classified (R26.2);Pain Pain - Right/Left: Left Pain - part of body: Hip     Time: 5093-2671 PT Time Calculation (min) (ACUTE ONLY): 14 min  Charges:  $Gait Training: 8-22 mins                     Blondell Reveal Kistler PT 04/01/2019  Acute Rehabilitation Services Pager [phone] Office [phone]

## 2019-04-02 ENCOUNTER — Telehealth: Payer: Self-pay | Admitting: Emergency Medicine

## 2019-04-02 ENCOUNTER — Encounter: Payer: Self-pay | Admitting: Emergency Medicine

## 2019-04-02 NOTE — Telephone Encounter (Addendum)
04/02/2019 - PATIENT HAD AN APPOINTMENT SCHEDULED ON Thursday (04/02/2019) WITH DR. Kittie Plater TO ESTABLISH CARE AND TO GET A  SURGERY CLEARANCE TO HAVE A HIP REPLACEMENT DONE. HE WAS A NO SHOW. WHEN I WENT INTO HIS CHART TO SEE IF HE HAS HAD ANY NO SHOW LETTERS SENT TO HIM, I SAW HE ALREADY HAD HIS HIP SURGERY ON 03/31/19. I ASKED JASON IF WE COULD NOT SEND HIM A LETTER DUE TO SURGERY AND HE AGREED. JASON SAID I SHOULD CALL THE PATIENT AND TELL HIM TO RE-SCHEDULE WHEN HE CAN. I TRIED TO CALL BUT HIS VOICE MAIL IS FULL AND IT IS NOT EXCEPTING ANY MESSAGES. [place]

## 2019-04-06 NOTE — Discharge Summary (Signed)
Physician Discharge Summary  Patient ID: Chris Williams MRN: [id number] DOB/AGE: [birth year] 48 y.o.  Admit date: 03/31/2019 Discharge date: 04/01/2019   Procedures:  Procedure(s) (LRB): TOTAL HIP ARTHROPLASTY ANTERIOR APPROACH (Left)  Attending Physician:  Dr. Durene RomansMatthew Olin   Admission Diagnoses:   Left hip trauma / pain  Discharge Diagnoses:  Active Problems:   Obese  Past Medical History:  Diagnosis Date  . Anxiety   . Arthritis    hip  . History of kidney stones     HPI:     Chris Williams, 48 y.o. male, has a history of pain and functional disability in the left hip(s) due to trauma and arthritis and patient has failed non-surgical conservative treatments for greater than 12 weeks to include NSAID's and/or analgesics, corticosteriod injections and activity modification.  Onset of symptoms was abrupt starting September 25, 2016 with an incident at work gradually worsening course since that time.The patient noted no past surgery on the left hip(s).  Patient currently rates pain in the left hip at 9 out of 10 with activity. Patient has night pain, worsening of pain with activity and weight bearing, trendelenberg gait, pain that interfers with activities of daily living and pain with passive range of motion. Patient has evidence of joint space narrowing by imaging studies. This condition presents safety issues increasing the risk of falls. There is no current active infection.  Risks, benefits and expectations were discussed with the patient.  Risks including but not limited to the risk of anesthesia, blood clots, nerve damage, blood vessel damage, failure of the prosthesis, infection and up to and including death.  Patient understand the risks, benefits and expectations and wishes to proceed with surgery.   PCP: Magdalene RiverWiseman, Brittany D, PA-C   Discharged Condition: good  Hospital Course:  Patient underwent the above stated procedure on 03/31/2019. Patient tolerated the procedure well and brought  to the recovery room in good condition and subsequently to the floor.  POD #1 BP: 124/72 ; Pulse: 71 ; Temp: 98.4 F (36.9 C) ; Resp: 16 Patient reports pain as mild, pain controlled.  No reported events throughout the night.  Patient feels that the hip is doing well as compared to prior to surgery.  Feels that he is progressing well.  Patient is ready be discharged home, if he does well with therapy.  Patient follow-up in the clinic in 2 weeks.  Patient is planning on home exercise program. Dorsiflexion/plantar flexion intact, incision: dressing C/D/I, no cellulitis present and compartment soft.   LABS  Basename    HGB     14.5  HCT     44.4    Discharge Exam: General appearance: alert, cooperative and no distress Extremities: Homans sign is negative, no sign of DVT, no edema, redness or tenderness in the calves or thighs and no ulcers, gangrene or trophic changes  Disposition:  Home with follow up in 2 weeks   Follow-up Information    Durene Romanslin, Shelvia Fojtik, MD. Schedule an appointment as soon as possible for a visit in 2 weeks.   Specialty: Orthopedic Surgery Contact information: [address] [place] [id number] [phone]           Discharge Instructions    Call MD / Call 911   Complete by: As directed    If you experience chest pain or shortness of breath, CALL 911 and be transported to the hospital emergency room.  If you develope a fever above 101 F, pus (white drainage)  or increased drainage or redness at the wound, or calf pain, call your surgeon's office.   Change dressing   Complete by: As directed    Maintain surgical dressing until follow up in the clinic. If the edges start to pull up, may reinforce with tape. If the dressing is no longer working, may remove and cover with gauze and tape, but must keep the area dry and clean.  Call with any questions or concerns.   Constipation Prevention   Complete by: As directed    Drink plenty of fluids.  Prune  juice may be helpful.  You may use a stool softener, such as Colace (over the counter) 100 mg twice a day.  Use MiraLax (over the counter) for constipation as needed.   Diet - low sodium heart healthy   Complete by: As directed    Discharge instructions   Complete by: As directed    Maintain surgical dressing until follow up in the clinic. If the edges start to pull up, may reinforce with tape. If the dressing is no longer working, may remove and cover with gauze and tape, but must keep the area dry and clean.  Follow up in 2 weeks at [facility]. Call with any questions or concerns.   Increase activity slowly as tolerated   Complete by: As directed    Weight bearing as tolerated with assist device (walker, cane, etc) as directed, use it as long as suggested by your surgeon or therapist, typically at least 4-6 weeks.   TED hose   Complete by: As directed    Use stockings (TED hose) for 2 weeks on both leg(s).  You may remove them at night for sleeping.      Allergies as of 04/01/2019   No Known Allergies     Medication List    TAKE these medications   albuterol 108 (90 Base) MCG/ACT inhaler Commonly known as: VENTOLIN HFA Inhale 2 puffs into the lungs every 4 (four) hours as needed for wheezing or shortness of breath (cough, shortness of breath or wheezing.).   albuterol (2.5 MG/3ML) 0.083% nebulizer solution Commonly known as: PROVENTIL Take 3 mLs (2.5 mg total) by nebulization every 6 (six) hours as needed for wheezing or shortness of breath.   aspirin 81 MG chewable tablet Commonly known as: Aspirin Childrens Chew 1 tablet (81 mg total) by mouth 2 (two) times daily. Take for 4 weeks, then resume regular dose.   docusate sodium 100 MG capsule Commonly known as: Colace Take 1 capsule (100 mg total) by mouth 2 (two) times daily.   ferrous sulfate 325 (65 FE) MG tablet Commonly known as: FerrouSul Take 1 tablet (325 mg total) by mouth 3 (three) times daily with meals  for 14 days.   Fluticasone Furoate 200 MCG/ACT Aepb Commonly known as: Arnuity Ellipta Inhale 1 puff into the lungs daily. What changed:   when to take this  reasons to take this   HYDROcodone-acetaminophen 7.5-325 MG tablet Commonly known as: Norco Take 1-2 tablets by mouth every 4 (four) hours as needed for moderate pain.   hydrOXYzine 25 MG tablet Commonly known as: ATARAX/VISTARIL Take 0.5-1 tablets (12.5-25 mg total) by mouth every 8 (eight) hours as needed.   methocarbamol 500 MG tablet Commonly known as: Robaxin Take 1 tablet (500 mg total) by mouth every 6 (six) hours as needed for muscle spasms.   polyethylene glycol 17 g packet Commonly known as: MIRALAX / GLYCOLAX Take 17 g by mouth 2 (two)  times daily.            Discharge Care Instructions  (From admission, onward)         Start     Ordered   04/01/19 0000  Change dressing    Comments: Maintain surgical dressing until follow up in the clinic. If the edges start to pull up, may reinforce with tape. If the dressing is no longer working, may remove and cover with gauze and tape, but must keep the area dry and clean.  Call with any questions or concerns.   04/01/19 0755           Signed: [place] Pugh. Tishana Clinkenbeard   PA-C  04/06/2019, 8:40 PM

## 2019-05-12 ENCOUNTER — Telehealth (INDEPENDENT_AMBULATORY_CARE_PROVIDER_SITE_OTHER): Payer: Self-pay | Admitting: Adult Health Nurse Practitioner

## 2019-05-12 ENCOUNTER — Encounter: Payer: Self-pay | Admitting: Adult Health Nurse Practitioner

## 2019-05-12 DIAGNOSIS — R110 Nausea: Secondary | ICD-10-CM

## 2019-05-12 HISTORY — DX: Nausea: R11.0

## 2019-05-12 MED ORDER — ONDANSETRON HCL 4 MG PO TABS: 4.0000 mg | ORAL_TABLET | ORAL | 0 refills | Status: DC | PRN

## 2019-05-12 NOTE — Progress Notes (Signed)
CC:  Nausea and loss of appetite x 4-5 days. Noticed it started after he had a busy day and didn't get a chance to eat.  Has been drinking Gatorade and taking Pepto-Bismol. Has been sweating at night.  No fever, vomiting. No exposure to illness that he is aware of.

## 2019-05-12 NOTE — Progress Notes (Signed)
Telemedicine Encounter- SOAP NOTE Established Patient  This telephone encounter was conducted with the patient's (or proxy's) verbal consent via audio telecommunications: yes/no: Yes Patient was instructed to have this encounter in a suitably private space; and to only have persons present to whom they give permission to participate. In addition, patient identity was confirmed by use of name plus two identifiers (DOB and address).  I discussed the limitations, risks, security and privacy concerns of performing an evaluation and management service by telephone and the availability of in person appointments. I also discussed with the patient that there may be a patient responsible charge related to this service. The patient expressed understanding and agreed to proceed.  I spent a total of TIME; 0 MIN TO 60 MIN: 15 minutes talking with the patient or their proxy.  No chief complaint on file.   Subjective   Chris Williams is a 48 y.o. established patient. Telephone visit today for nausea   HPI   Patient reports that since Thursday he has been feeling nauseous.  He has a bad habit of not eating or drinking during the day.  He did this on Thursday and has felt nauseous and very fatigued since.  Has not been rehydrating or eating small meals.  Has felt like he had to vomit but no vomiting.  No diarrhea.  He has had night sweats over the past 3 nights.  No loss of taste or smell.    Patient Active Problem List   Diagnosis Date Noted  . Nausea without vomiting 05/12/2019  . Obese 04/01/2019  . Asthma, extrinsic, moderate persistent, uncomplicated 04/07/2018  . CKD (chronic kidney disease) stage 2, GFR 60-89 ml/min 02/18/2018  . Nephrolithiasis 01/21/2018    Past Medical History:  Diagnosis Date  . Anxiety   . Arthritis    hip  . History of kidney stones   . Nausea without vomiting 05/12/2019    Current Outpatient Medications  Medication Sig Dispense Refill  . albuterol (PROVENTIL  HFA;VENTOLIN HFA) 108 (90 Base) MCG/ACT inhaler Inhale 2 puffs into the lungs every 4 (four) hours as needed for wheezing or shortness of breath (cough, shortness of breath or wheezing.). 1 Inhaler 1  . albuterol (PROVENTIL) (2.5 MG/3ML) 0.083% nebulizer solution Take 3 mLs (2.5 mg total) by nebulization every 6 (six) hours as needed for wheezing or shortness of breath. 150 mL 0  . docusate sodium (COLACE) 100 MG capsule Take 1 capsule (100 mg total) by mouth 2 (two) times daily. (Patient not taking: Reported on 05/12/2019) 28 capsule 0  . ferrous sulfate (FERROUSUL) 325 (65 FE) MG tablet Take 1 tablet (325 mg total) by mouth 3 (three) times daily with meals for 14 days. 42 tablet 0  . Fluticasone Furoate (ARNUITY ELLIPTA) 200 MCG/ACT AEPB Inhale 1 puff into the lungs daily. (Patient not taking: Reported on 05/12/2019) 30 each 6  . HYDROcodone-acetaminophen (NORCO) 7.5-325 MG tablet Take 1-2 tablets by mouth every 4 (four) hours as needed for moderate pain. (Patient not taking: Reported on 05/12/2019) 60 tablet 0  . hydrOXYzine (ATARAX/VISTARIL) 25 MG tablet Take 0.5-1 tablets (12.5-25 mg total) by mouth every 8 (eight) hours as needed. (Patient not taking: Reported on 05/12/2019) 30 tablet 0  . methocarbamol (ROBAXIN) 500 MG tablet Take 1 tablet (500 mg total) by mouth every 6 (six) hours as needed for muscle spasms. (Patient not taking: Reported on 05/12/2019) 40 tablet 0  . ondansetron (ZOFRAN) 4 MG tablet Take 1 tablet (4 mg total)  by mouth every 4 (four) hours as needed for nausea or vomiting. 30 tablet 0  . polyethylene glycol (MIRALAX / GLYCOLAX) 17 g packet Take 17 g by mouth 2 (two) times daily. (Patient not taking: Reported on 05/12/2019) 28 packet 0   No current facility-administered medications for this visit.     No Known Allergies  Social History   Socioeconomic History  . Marital status: Single    Spouse name: Not on file  . Number of children: 3  . Years of education: Not on file  .  Highest education level: Not on file  Occupational History  . Not on file  Social Needs  . Financial resource strain: Somewhat hard  . Food insecurity    Worry: Never true    Inability: Never true  . Transportation needs    Medical: No    Non-medical: No  Tobacco Use  . Smoking status: Never Smoker  . Smokeless tobacco: Never Used  Substance and Sexual Activity  . Alcohol use: No  . Drug use: No  . Sexual activity: Yes    Partners: Female    Comment: with monogamous partner  Lifestyle  . Physical activity    Days per week: 1 day    Minutes per session: 60 min  . Stress: To some extent  Relationships  . Social connections    Talks on phone: More than three times a week    Gets together: Once a week    Attends religious service: More than 4 times per year    Active member of club or organization: Yes    Attends meetings of clubs or organizations: More than 4 times per year    Relationship status: Living with partner  . Intimate partner violence    Fear of current or ex partner: No    Emotionally abused: No    Physically abused: No    Forced sexual activity: No  Other Topics Concern  . Not on file  Social History Narrative   Pt is from [place], [place]. Has been in [place] for 30 years. Lives with significant other, daughter, and grandchild.     Review of Systems  Constitutional: Positive for loss of appetite, fatigue, night sweats.  HENT: Negative for congestion, nosebleeds, trouble swallowing and voice change.   Respiratory: Negative for cough, shortness of breath and wheezing.   Gastrointestinal: Negative for diarrhea, positive for nausea, negative for vomiting.  Genitourinary: Negative for difficulty urinating, dysuria, flank pain and hematuria.  Musculoskeletal: Negative for back pain, joint swelling and neck pain.  Neurological: Negative for dizziness, speech difficulty, light-headedness and numbness.  See HPI. All other review of systems negative.     Objective      GEN: WDWN, NAD, Non-toxic, Alert & Oriented x 3 PSYCH: Normally interactive. Conversant. Not depressed or anxious appearing.  Calm demeanor.    Vitals as reported by the patient: Today's Vitals   05/12/19 1037  BP: 137/86  Pulse: 94  Weight: 235 lb (106.6 kg)  Height: 6\' 2"  (1.88 m)    Diagnoses and all orders for this visit:  Nausea without vomiting  Other orders -     ondansetron (ZOFRAN) 4 MG tablet; Take 1 tablet (4 mg total) by mouth every 4 (four) hours as needed for nausea or vomiting.   Reviewed his symptoms.  Encouraged to eat small meals and to hydrate.  Suggested adding Pedialyte or Gatorade.  He does have Stage 2 kidney disease and needs to be drinking plenty of  fluids.  Sent in Zofran for nausea.  If symptoms persist or worsen, will f/u.  Patient satisfied with plan.   I discussed the assessment and treatment plan with the patient. The patient was provided an opportunity to ask questions and all were answered. The patient agreed with the plan and demonstrated an understanding of the instructions.   The patient was advised to call back or seek an in-person evaluation if the symptoms worsen or if the condition fails to improve as anticipated.  I provided 15 minutes of non-face-to-face time during this encounter.  Elyse Jarvis, NP  Primary Care at [facility]

## 2019-09-27 IMAGING — DX DG CHEST 2V
2 series · 2 of 2 positions shown · non-contrast
Comparison: 05/16/2016

CLINICAL DATA: Shortness of breath and chest tightness.

EXAM:
CHEST - 2 VIEW

[chest pa]
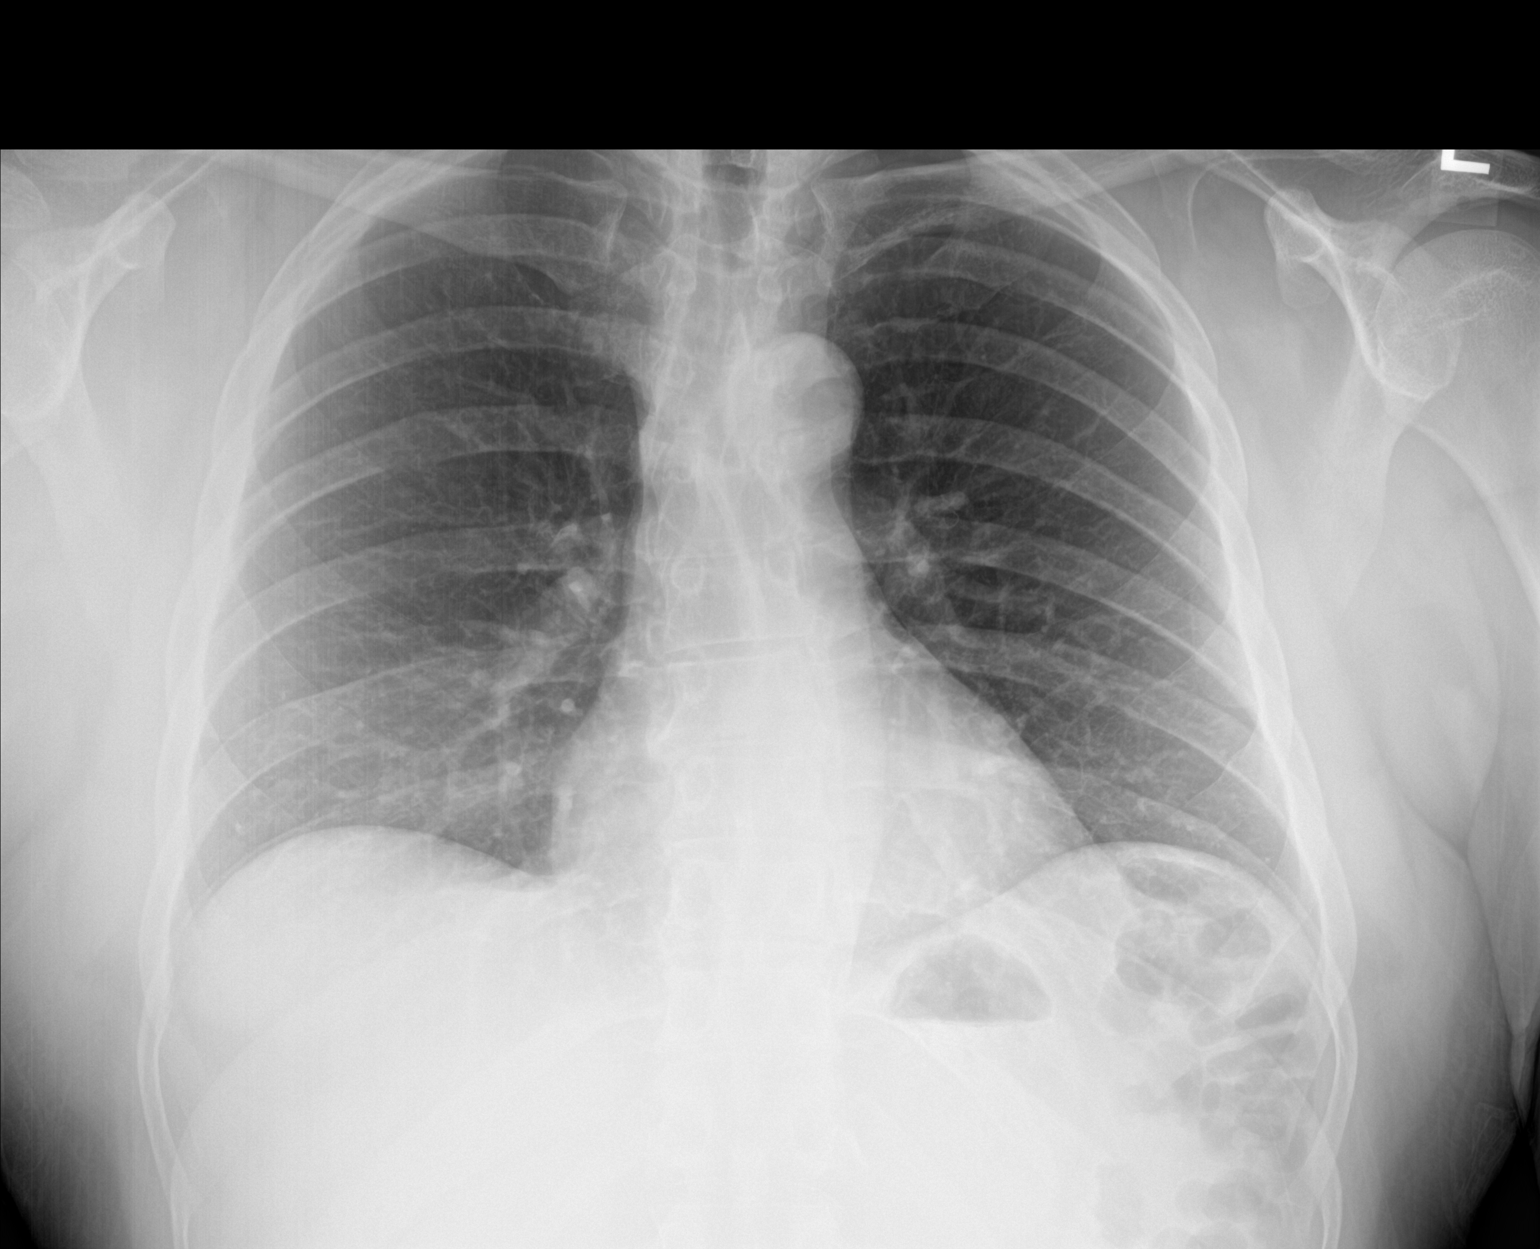

[chest lat]
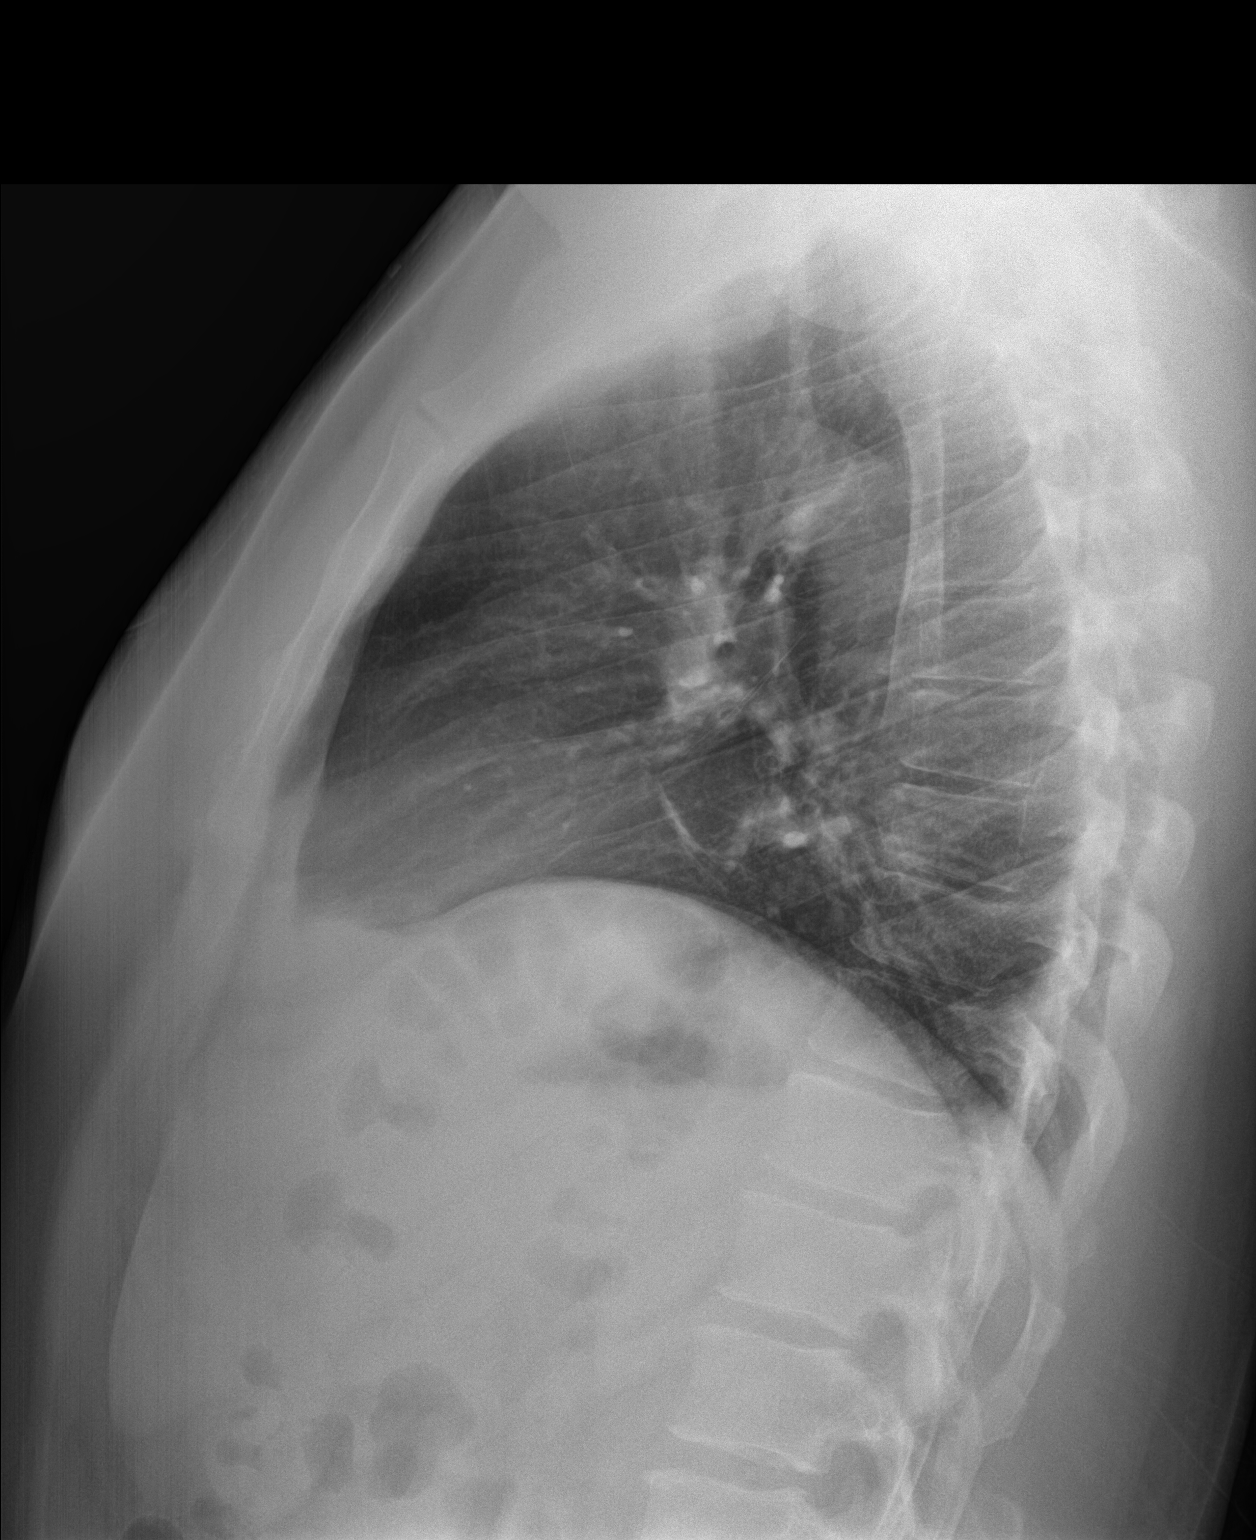

[2 of 2 positions shown; findings below may reference images not displayed]

FINDINGS: The heart size and mediastinal contours are within normal limits.
There is no evidence of pulmonary edema, consolidation,
pneumothorax, nodule or pleural fluid. The visualized skeletal
structures are unremarkable. There is a stable mild rightward convex
thoracic scoliosis.
IMPRESSION: No active cardiopulmonary disease.

## 2019-09-27 IMAGING — CR DG CHEST 2V
2 series · 2 of 2 positions shown · non-contrast
Comparison: 01/21/2018

CLINICAL DATA: Shortness of breath for 2 days.  Chest pain.

EXAM:
CHEST - 2 VIEW

[w chest pa]
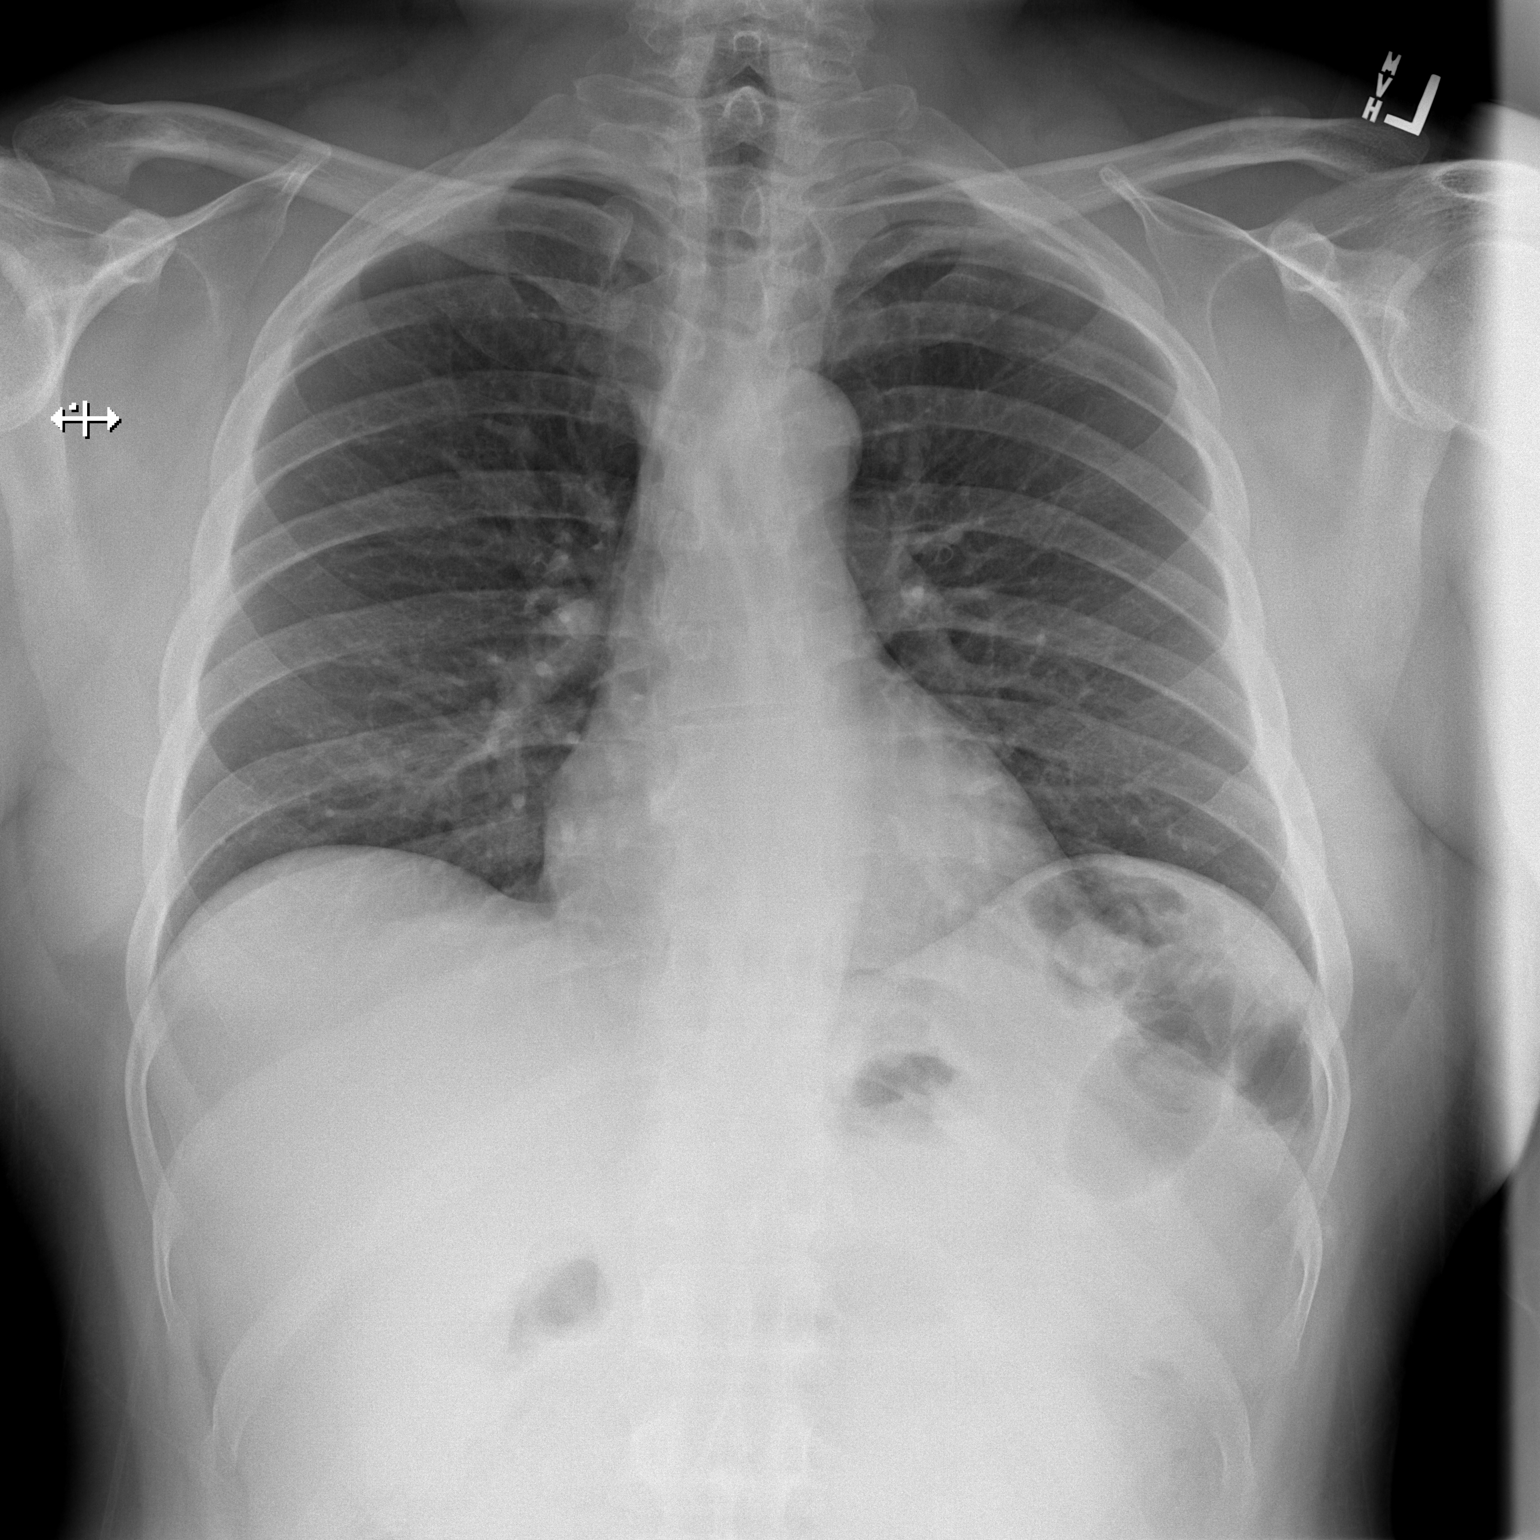

[w chest lat]
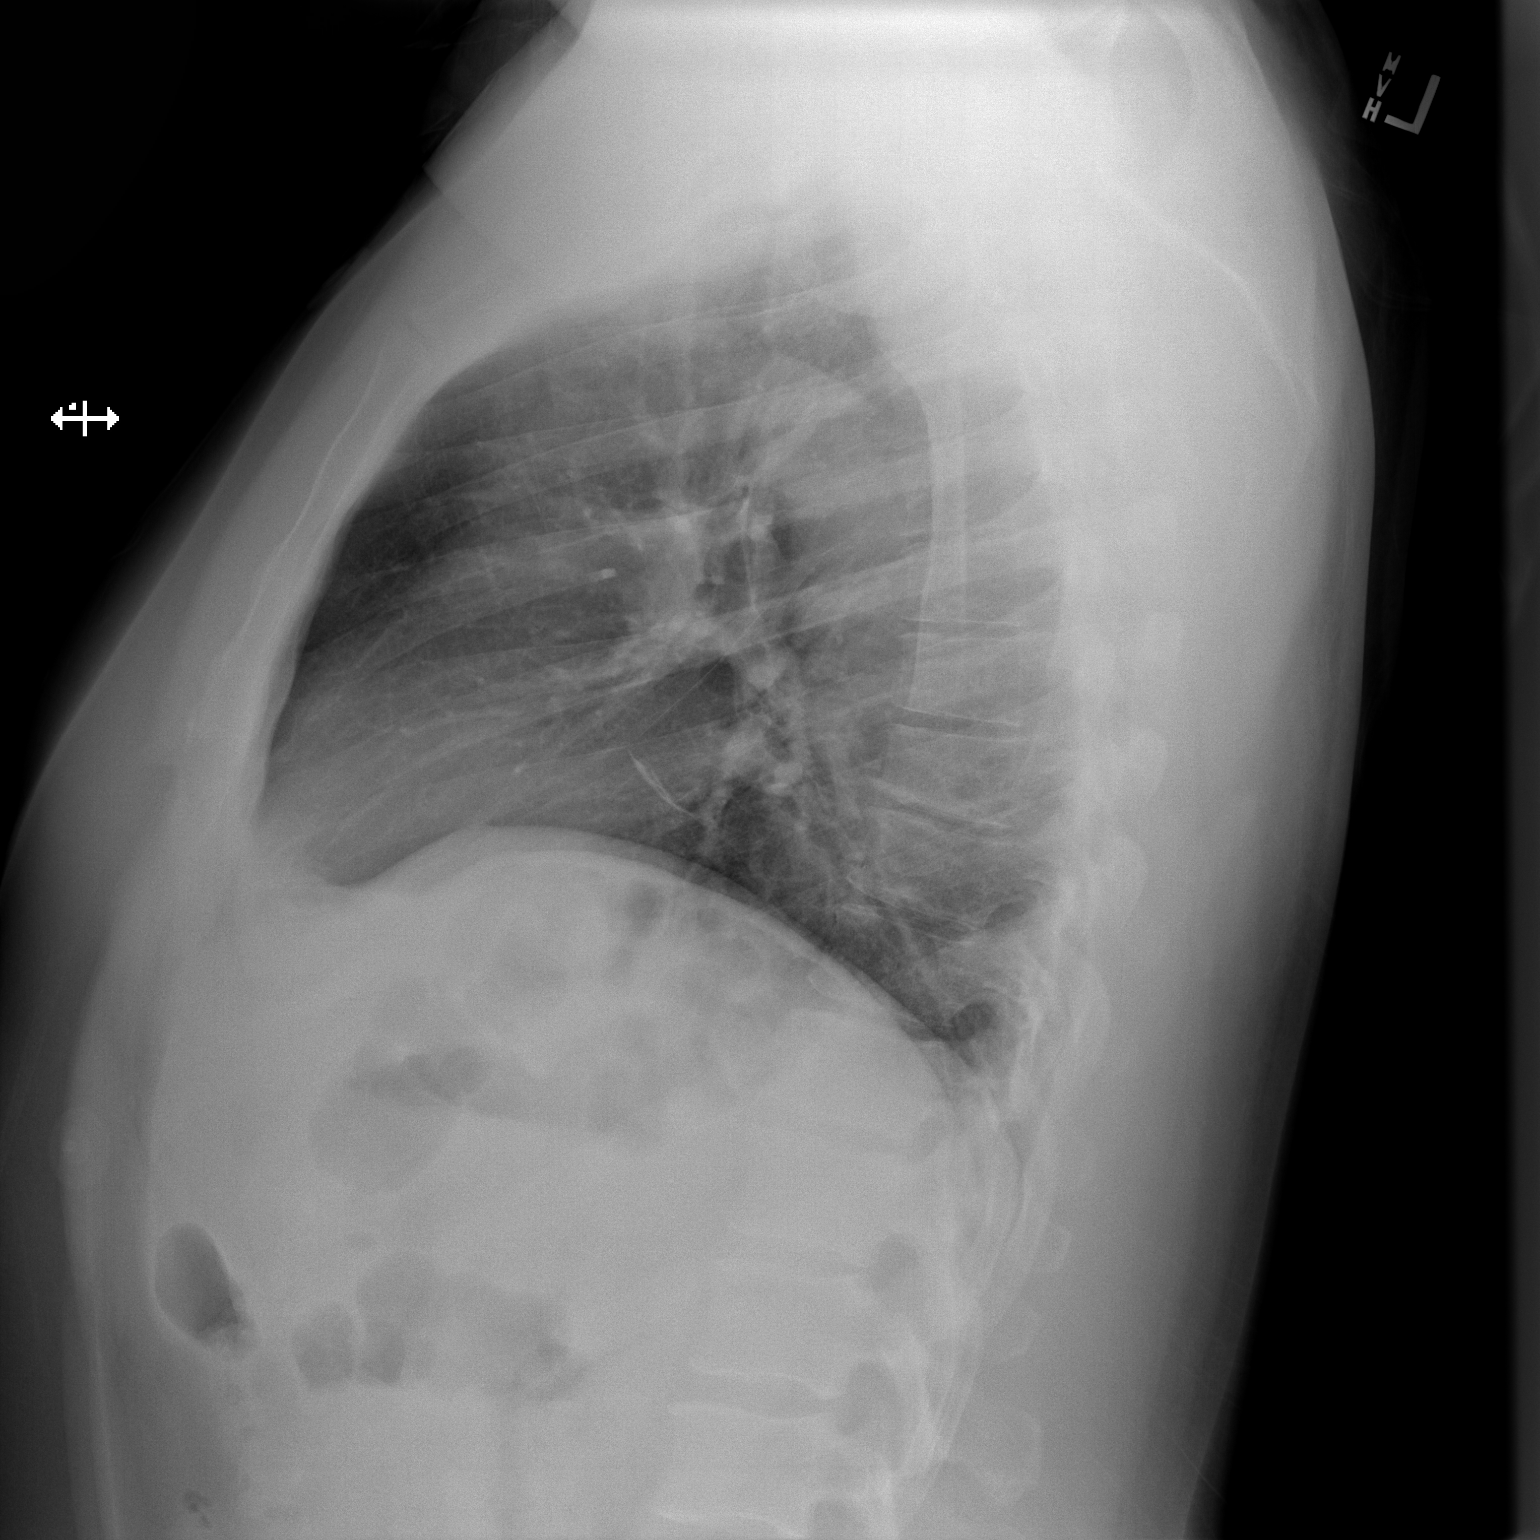

[2 of 2 positions shown; findings below may reference images not displayed]

FINDINGS: The cardiomediastinal silhouette is within normal limits. No
confluent airspace opacity, edema, pleural effusion, or pneumothorax
is identified. There is minimal basilar scarring. No acute osseous
abnormality is seen.
IMPRESSION: No active cardiopulmonary disease.

## 2020-03-17 ENCOUNTER — Emergency Department (HOSPITAL_BASED_OUTPATIENT_CLINIC_OR_DEPARTMENT_OTHER)
Admission: EM | Admit: 2020-03-17 | Discharge: 2020-03-17 | Disposition: A | Discharge status: Home / Self Care | Payer: 59 | Attending: Emergency Medicine | Admitting: Emergency Medicine

## 2020-03-17 ENCOUNTER — Other Ambulatory Visit: Payer: Self-pay

## 2020-03-17 ENCOUNTER — Encounter (HOSPITAL_BASED_OUTPATIENT_CLINIC_OR_DEPARTMENT_OTHER): Payer: Self-pay | Admitting: Emergency Medicine

## 2020-03-17 DIAGNOSIS — R0981 Nasal congestion: Secondary | ICD-10-CM | POA: Insufficient documentation

## 2020-03-17 DIAGNOSIS — I10 Essential (primary) hypertension: Secondary | ICD-10-CM | POA: Insufficient documentation

## 2020-03-17 DIAGNOSIS — Z96642 Presence of left artificial hip joint: Secondary | ICD-10-CM | POA: Insufficient documentation

## 2020-03-17 MED ORDER — FLUTICASONE PROPIONATE 50 MCG/ACT NA SUSP: 1.0000 | Freq: Every day | NASAL | 2 refills | Status: AC

## 2020-03-17 NOTE — ED Provider Notes (Signed)
[facility] Emergency Department Provider Note MRN:  [id number]  Arrival date & time: 03/17/20     Chief Complaint   Hypertension   History of Present Illness   Chris Williams is a 49 y.o. year-old male with a history of anxiety presenting to the ED with chief complaint of hypertension.  Patient has noticed high blood pressure for the past 3 days for measuring at home.  Is also had some nasal congestion for 3 days and has been taking over-the-counter Sudafed.  Blood pressure in the 150s.  Nasal congestion causes some pressure feeling to the front of the head and behind the eyes.  Denies any nausea or vomiting, no chest pain, no shortness of breath, no abdominal pain.  No other complaints.  Review of Systems  A complete 10 system review of systems was obtained and all systems are negative except as noted in the HPI and PMH.   Patient's Health History    Past Medical History:  Diagnosis Date  . Anxiety   . Arthritis    hip  . History of kidney stones   . Nausea without vomiting 05/12/2019    Past Surgical History:  Procedure Laterality Date  . CYSTOSCOPY/RETROGRADE/URETEROSCOPY/STONE EXTRACTION WITH BASKET Right 09/29/2012   Procedure: CYSTOSCOPY/RETROGRADE/URETEROSCOPY/STONE EXTRACTION WITH BASKET;  Surgeon: Anner Crete, MD;  Location: [facility];  Service: Urology;  Laterality: Right;  . HOLMIUM LASER APPLICATION Right 09/29/2012   Procedure: HOLMIUM LASER APPLICATION;  Surgeon: Anner Crete, MD;  Location: [facility];  Service: Urology;  Laterality: Right;  . TOTAL HIP ARTHROPLASTY Left 03/31/2019   Procedure: TOTAL HIP ARTHROPLASTY ANTERIOR APPROACH;  Surgeon: Durene Romans, MD;  Location: [facility];  Service: Orthopedics;  Laterality: Left;  70 mins    Family History  Problem Relation Age of Onset  . Hypertension Father   . CAD Other   . Deep vein thrombosis Other   . Coronary artery disease Other   . Hypertension Mother   . Cancer Maternal Grandmother     . Hyperlipidemia Maternal Grandmother   . Stroke Maternal Grandmother   . Cancer Maternal Grandfather   . Hyperlipidemia Maternal Grandfather   . Stroke Maternal Grandfather   . Cancer Paternal Grandmother   . Hyperlipidemia Paternal Grandmother   . Cancer Paternal Grandfather   . Hyperlipidemia Paternal Grandfather     Social History   Socioeconomic History  . Marital status: Single    Spouse name: Not on file  . Number of children: 3  . Years of education: Not on file  . Highest education level: Not on file  Occupational History  . Not on file  Tobacco Use  . Smoking status: Never Smoker  . Smokeless tobacco: Never Used  Vaping Use  . Vaping Use: Never used  Substance and Sexual Activity  . Alcohol use: No  . Drug use: No  . Sexual activity: Yes    Partners: Female    Comment: with monogamous partner  Other Topics Concern  . Not on file  Social History Narrative   Pt is from [place], [place]. Has been in [place] for 30 years. Lives with significant other, daughter, and grandchild.    Social Determinants of Health   Financial Resource Strain:   . Difficulty of Paying Living Expenses: Not on file  Food Insecurity:   . Worried About Programme researcher, broadcasting/film/video in the Last Year: Not on file  . Ran Out of Food in the Last Year: Not on file  Transportation Needs:   . Freight forwarder (Medical): Not on file  . Lack of Transportation (Non-Medical): Not on file  Physical Activity:   . Days of Exercise per Week: Not on file  . Minutes of Exercise per Session: Not on file  Stress:   . Feeling of Stress : Not on file  Social Connections:   . Frequency of Communication with Friends and Family: Not on file  . Frequency of Social Gatherings with Friends and Family: Not on file  . Attends Religious Services: Not on file  . Active Member of Clubs or Organizations: Not on file  . Attends Banker Meetings: Not on file  . Marital Status: Not on file  Intimate Partner  Violence:   . Fear of Current or Ex-Partner: Not on file  . Emotionally Abused: Not on file  . Physically Abused: Not on file  . Sexually Abused: Not on file     Physical Exam   Vitals:   03/17/20 1719 03/17/20 1954  BP: (!) 148/107 (!) 145/102  Pulse: 76 65  Resp: 18 16  Temp: 99 F (37.2 C) 98.4 F (36.9 C)  SpO2: 99% 98%    CONSTITUTIONAL: Well-appearing, NAD NEURO:  Alert and oriented x 3, normal and symmetric strength sensation, normal coordination, normal speech EYES:  eyes equal and reactive ENT/NECK:  no LAD, no JVD CARDIO: Regular rate, well-perfused, normal S1 and S2 PULM:  CTAB no wheezing or rhonchi GI/GU:  normal bowel sounds, non-distended, non-tender MSK/SPINE:  No gross deformities, no edema SKIN:  no rash, atraumatic PSYCH:  Appropriate speech and behavior  *Additional and/or pertinent findings included in MDM below  Diagnostic and Interventional Summary    EKG Interpretation  Date/Time:    Ventricular Rate:    PR Interval:    QRS Duration:   QT Interval:    QTC Calculation:   R Axis:     Text Interpretation:        Labs Reviewed - No data to display  No orders to display    Medications - No data to display   Procedures  /  Critical Care Procedures  ED Course and Medical Decision Making  I have reviewed the triage vital signs, the nursing notes, and pertinent available records from the EMR.  Listed above are laboratory and imaging tests that I personally ordered, reviewed, and interpreted and then considered in my medical decision making (see below for details).  Exam consistent with sinus pressure and otherwise reassuring from a neurological perspective, no chest pain, reassuring vital signs, swollen nasal concavity, only 3 days without fever, no indication for antibiotics at this time.  Provided reassurance for his minimally elevated high blood pressure, appropriate for discharge with PCP follow-up.       Elmer Sow. Pilar Plate, MD [facility]  Health Emergency Medicine [facility] [email or web address]  Final Clinical Impressions(s) / ED Diagnoses     ICD-10-CM   1. Nasal congestion  R09.81   2. Hypertension, unspecified type  I10     ED Discharge Orders         Ordered    fluticasone (FLONASE) 50 MCG/ACT nasal spray  Daily        03/17/20 2007           Discharge Instructions Discussed with and Provided to Patient:     Discharge Instructions     You were evaluated in the Emergency Department and after careful evaluation, we did not find any emergent condition requiring  admission or further testing in the hospital.  Your exam/testing today is overall reassuring.  Your symptoms seem to be due to inflammation of the nasal passages, possibly due to a virus.  We recommend the nasal spray provided.  We also recommend follow-up with primary care doctor to discuss your blood pressure.  Please return to the Emergency Department if you experience any worsening of your condition.   Thank you for allowing [place] to be a part of your care.       Sabas Sous, MD 03/17/20 2009

## 2020-03-17 NOTE — Discharge Instructions (Signed)
You were evaluated in the Emergency Department and after careful evaluation, we did not find any emergent condition requiring admission or further testing in the hospital.  Your exam/testing today is overall reassuring.  Your symptoms seem to be due to inflammation of the nasal passages, possibly due to a virus.  We recommend the nasal spray provided.  We also recommend follow-up with primary care doctor to discuss your blood pressure.  Please return to the Emergency Department if you experience any worsening of your condition.   Thank you for allowing [place] to be a part of your care.

## 2020-03-17 NOTE — ED Triage Notes (Signed)
Pt reports he has been taking his BP and it has been running high over the last week. Also endorses headache.

## 2020-08-22 ENCOUNTER — Emergency Department
Admission: EM | Admit: 2020-08-22 | Discharge: 2020-08-22 | Disposition: A | Discharge status: Home / Self Care | Payer: 59 | Attending: Emergency Medicine | Admitting: Emergency Medicine

## 2020-08-22 ENCOUNTER — Encounter: Payer: Self-pay | Admitting: Emergency Medicine

## 2020-08-22 ENCOUNTER — Other Ambulatory Visit: Payer: Self-pay

## 2020-08-22 ENCOUNTER — Emergency Department: Payer: 59

## 2020-08-22 DIAGNOSIS — Z7952 Long term (current) use of systemic steroids: Secondary | ICD-10-CM | POA: Insufficient documentation

## 2020-08-22 DIAGNOSIS — N200 Calculus of kidney: Secondary | ICD-10-CM | POA: Diagnosis not present

## 2020-08-22 DIAGNOSIS — N182 Chronic kidney disease, stage 2 (mild): Secondary | ICD-10-CM | POA: Diagnosis not present

## 2020-08-22 DIAGNOSIS — Z96642 Presence of left artificial hip joint: Secondary | ICD-10-CM | POA: Diagnosis not present

## 2020-08-22 DIAGNOSIS — J4540 Moderate persistent asthma, uncomplicated: Secondary | ICD-10-CM | POA: Insufficient documentation

## 2020-08-22 DIAGNOSIS — R109 Unspecified abdominal pain: Secondary | ICD-10-CM | POA: Diagnosis present

## 2020-08-22 LAB — URINALYSIS, COMPLETE (UACMP) WITH MICROSCOPIC
Bacteria, UA: NONE SEEN
RBC / HPF: >50 RBC/hpf — ABNORMAL HIGH (ref 0–5)
Specific Gravity, Urine: 1.026 (ref 1.005–1.030)
WBC, UA: >50 WBC/hpf — ABNORMAL HIGH (ref 0–5)

## 2020-08-22 LAB — CBC
HCT: 45.7 % (ref 39.0–52.0)
Hemoglobin: 15.7 g/dL (ref 13.0–17.0)
MCH: 30.3 pg (ref 26.0–34.0)
MCHC: 34.4 g/dL (ref 30.0–36.0)
MCV: 88.2 fL (ref 80.0–100.0)
Platelets: 162 10*3/uL (ref 150–400)
RBC: 5.18 MIL/uL (ref 4.22–5.81)
RDW: 12.7 % (ref 11.5–15.5)
WBC: 9.9 10*3/uL (ref 4.0–10.5)
nRBC: 0 % (ref 0.0–0.2)

## 2020-08-22 LAB — BASIC METABOLIC PANEL
Anion gap: 10 (ref 5–15)
BUN: 12 mg/dL (ref 6–20)
CO2: 23 mmol/L (ref 22–32)
Calcium: 9.1 mg/dL (ref 8.9–10.3)
Chloride: 107 mmol/L (ref 98–111)
Creatinine, Ser: 1.61 mg/dL — ABNORMAL HIGH (ref 0.61–1.24)
GFR, Estimated: 52 mL/min — ABNORMAL LOW (ref >60)
Glucose, Bld: 123 mg/dL — ABNORMAL HIGH (ref 70–99)
Potassium: 3.6 mmol/L (ref 3.5–5.1)
Sodium: 140 mmol/L (ref 135–145)

## 2020-08-22 MED ORDER — TAMSULOSIN HCL 0.4 MG PO CAPS: 0.4000 mg | ORAL_CAPSULE | Freq: Every day | ORAL | 0 refills | Status: DC

## 2020-08-22 MED ORDER — HYDROCODONE-ACETAMINOPHEN 5-325 MG PO TABS
1.0000 | ORAL_TABLET | Freq: Once | ORAL | Status: AC
  Administered 2020-08-22: 1 via ORAL
  Filled 2020-08-22: qty 1

## 2020-08-22 MED ORDER — ONDANSETRON HCL 4 MG/2ML IJ SOLN
4.0000 mg | Freq: Once | INTRAMUSCULAR | Status: AC
  Administered 2020-08-22: 4 mg via INTRAVENOUS
  Filled 2020-08-22: qty 2

## 2020-08-22 MED ORDER — OXYCODONE-ACETAMINOPHEN 5-325 MG PO TABS: 1.0000 | ORAL_TABLET | Freq: Four times a day (QID) | ORAL | 0 refills | Status: AC | PRN

## 2020-08-22 MED ORDER — CEPHALEXIN 500 MG PO CAPS: 500.0000 mg | ORAL_CAPSULE | Freq: Two times a day (BID) | ORAL | 0 refills | Status: DC

## 2020-08-22 MED ORDER — KETOROLAC TROMETHAMINE 30 MG/ML IJ SOLN
30.0000 mg | Freq: Once | INTRAMUSCULAR | Status: AC
  Administered 2020-08-22: 30 mg via INTRAVENOUS
  Filled 2020-08-22: qty 1

## 2020-08-22 MED ORDER — ONDANSETRON 4 MG PO TBDP: 4.0000 mg | ORAL_TABLET | Freq: Three times a day (TID) | ORAL | 0 refills | Status: AC | PRN

## 2020-08-22 MED ORDER — NAPROXEN 500 MG PO TABS: 500.0000 mg | ORAL_TABLET | Freq: Two times a day (BID) | ORAL | 2 refills | Status: DC

## 2020-08-22 NOTE — ED Triage Notes (Addendum)
Pt in [facility] w/L flank/LLQ abdominal pain x 2 days. States he's been taking Tylenol and AZO w/some relief, but pain now unbearable, has had some hematuria. Has had kidney stone in past, 54yrs ago, required surgery. No fevers

## 2020-08-22 NOTE — ED Provider Notes (Signed)
[facility] Emergency Department Provider Note   ____________________________________________    I have reviewed the triage vital signs and the nursing notes.   HISTORY  Chief Complaint Flank Pain (/) and Abdominal Pain     HPI Chris Williams is a 50 y.o. male who presents with complaints of left flank pain.  Patient reports pain started approximately 2 days ago.  It has been continuous.  It does radiate into his left groin.  He reports it feels similar to having a kidney stone 8 years ago.  He reports mild nausea no vomiting.  Has noticed hematuria after pain started.  Has not take anything for this.  No fevers or chills.  No history of diabetes  Past Medical History:  Diagnosis Date  . Anxiety   . Arthritis    hip  . History of kidney stones   . Nausea without vomiting 05/12/2019    Patient Active Problem List   Diagnosis Date Noted  . Nausea without vomiting 05/12/2019  . Obese 04/01/2019  . Asthma, extrinsic, moderate persistent, uncomplicated 04/07/2018  . CKD (chronic kidney disease) stage 2, GFR 60-89 ml/min 02/18/2018  . Nephrolithiasis 01/21/2018    Past Surgical History:  Procedure Laterality Date  . CYSTOSCOPY/RETROGRADE/URETEROSCOPY/STONE EXTRACTION WITH BASKET Right 09/29/2012   Procedure: CYSTOSCOPY/RETROGRADE/URETEROSCOPY/STONE EXTRACTION WITH BASKET;  Surgeon: Anner Crete, MD;  Location: [facility];  Service: Urology;  Laterality: Right;  . HOLMIUM LASER APPLICATION Right 09/29/2012   Procedure: HOLMIUM LASER APPLICATION;  Surgeon: Anner Crete, MD;  Location: [facility];  Service: Urology;  Laterality: Right;  . TOTAL HIP ARTHROPLASTY Left 03/31/2019   Procedure: TOTAL HIP ARTHROPLASTY ANTERIOR APPROACH;  Surgeon: Durene Romans, MD;  Location: [facility];  Service: Orthopedics;  Laterality: Left;  70 mins    Prior to Admission medications   Medication Sig Start Date End Date Taking? Authorizing Provider  cephALEXin (KEFLEX) 500 MG capsule  Take 1 capsule (500 mg total) by mouth 2 (two) times daily. 08/22/20  Yes Jene Every, MD  naproxen (NAPROSYN) 500 MG tablet Take 1 tablet (500 mg total) by mouth 2 (two) times daily with a meal. 08/22/20  Yes Jene Every, MD  ondansetron (ZOFRAN ODT) 4 MG disintegrating tablet Take 1 tablet (4 mg total) by mouth every 8 (eight) hours as needed. 08/22/20  Yes Jene Every, MD  oxyCODONE-acetaminophen (PERCOCET) 5-325 MG tablet Take 1 tablet by mouth every 6 (six) hours as needed for severe pain. 08/22/20 08/22/21 Yes Jene Every, MD  tamsulosin (FLOMAX) 0.4 MG CAPS capsule Take 1 capsule (0.4 mg total) by mouth daily. 08/22/20  Yes Jene Every, MD  albuterol (PROVENTIL HFA;VENTOLIN HFA) 108 (90 Base) MCG/ACT inhaler Inhale 2 puffs into the lungs every 4 (four) hours as needed for wheezing or shortness of breath (cough, shortness of breath or wheezing.). 01/27/18   Benjiman Core D, PA-C  albuterol (PROVENTIL) (2.5 MG/3ML) 0.083% nebulizer solution Take 3 mLs (2.5 mg total) by nebulization every 6 (six) hours as needed for wheezing or shortness of breath. 01/27/18   Benjiman Core D, PA-C  docusate sodium (COLACE) 100 MG capsule Take 1 capsule (100 mg total) by mouth 2 (two) times daily. Patient not taking: Reported on 05/12/2019 04/01/19   Lanney Gins, PA-C  ferrous sulfate (FERROUSUL) 325 (65 FE) MG tablet Take 1 tablet (325 mg total) by mouth 3 (three) times daily with meals for 14 days. 04/01/19 04/15/19  Lanney Gins, PA-C  fluticasone (FLONASE) 50 MCG/ACT nasal spray Place  1 spray into both nostrils daily for 5 days. 03/17/20 03/22/20  Sabas Sous, MD  Fluticasone Furoate (ARNUITY ELLIPTA) 200 MCG/ACT AEPB Inhale 1 puff into the lungs daily. Patient not taking: Reported on 05/12/2019 04/07/18   Glenford Bayley, NP  HYDROcodone-acetaminophen (NORCO) 7.5-325 MG tablet Take 1-2 tablets by mouth every 4 (four) hours as needed for moderate pain. Patient not taking: Reported on  05/12/2019 04/01/19   Lanney Gins, PA-C  hydrOXYzine (ATARAX/VISTARIL) 25 MG tablet Take 0.5-1 tablets (12.5-25 mg total) by mouth every 8 (eight) hours as needed. Patient not taking: Reported on 05/12/2019 01/27/18   Benjiman Core D, PA-C  methocarbamol (ROBAXIN) 500 MG tablet Take 1 tablet (500 mg total) by mouth every 6 (six) hours as needed for muscle spasms. Patient not taking: Reported on 05/12/2019 04/01/19   Lanney Gins, PA-C  ondansetron (ZOFRAN) 4 MG tablet Take 1 tablet (4 mg total) by mouth every 4 (four) hours as needed for nausea or vomiting. 05/12/19   Royal Hawthorn, NP  polyethylene glycol (MIRALAX / GLYCOLAX) 17 g packet Take 17 g by mouth 2 (two) times daily. Patient not taking: Reported on 05/12/2019 04/01/19   Lanney Gins, PA-C     Allergies Patient has no known allergies.  Family History  Problem Relation Age of Onset  . Hypertension Father   . CAD Other   . Deep vein thrombosis Other   . Coronary artery disease Other   . Hypertension Mother   . Cancer Maternal Grandmother   . Hyperlipidemia Maternal Grandmother   . Stroke Maternal Grandmother   . Cancer Maternal Grandfather   . Hyperlipidemia Maternal Grandfather   . Stroke Maternal Grandfather   . Cancer Paternal Grandmother   . Hyperlipidemia Paternal Grandmother   . Cancer Paternal Grandfather   . Hyperlipidemia Paternal Grandfather     Social History Social History   Tobacco Use  . Smoking status: Never Smoker  . Smokeless tobacco: Never Used  Vaping Use  . Vaping Use: Never used  Substance Use Topics  . Alcohol use: No  . Drug use: No    Review of Systems  Constitutional: No fever/chills Eyes: No visual changes.  ENT: No sore throat. Cardiovascular: Denies chest pain. Respiratory: Denies shortness of breath. Gastrointestinal: As above.   Genitourinary: Negative for dysuria.  Hematuria as above Musculoskeletal: Negative for back pain. Skin: Negative for rash. Neurological:  Negative for headaches    ____________________________________________   PHYSICAL EXAM:  VITAL SIGNS: ED Triage Vitals  Enc Vitals Group     BP 08/22/20 1121 (!) 141/95     Pulse Rate 08/22/20 1121 61     Resp 08/22/20 1121 18     Temp 08/22/20 1121 98.3 F (36.8 C)     Temp Source 08/22/20 1121 Oral     SpO2 08/22/20 1121 98 %     Weight 08/22/20 1122 116 kg (255 lb 11.7 oz)     Height --      Head Circumference --      Peak Flow --      Pain Score --      Pain Loc --      Pain Edu? --      Excl. in GC? --     Constitutional: Alert and oriented.   Nose: No congestion/rhinnorhea. Mouth/Throat: Mucous membranes are moist.    Cardiovascular: Normal rate, regular rhythm. Grossly normal heart sounds.  Good peripheral circulation. Respiratory: Normal respiratory effort.  No retractions. Lungs CTAB. Gastrointestinal:  Soft and nontender. No distention.  No CVA tenderness.  Musculoskeletal: No lower extremity tenderness nor edema.  Warm and well perfused Neurologic:  Normal speech and language. No gross focal neurologic deficits are appreciated.  Skin:  Skin is warm, dry and intact. No rash noted. Psychiatric: Mood and affect are normal. Speech and behavior are normal.  ____________________________________________   LABS (all labs ordered are listed, but only abnormal results are displayed)  Labs Reviewed  URINALYSIS, COMPLETE (UACMP) WITH MICROSCOPIC - Abnormal; Notable for the following components:      Result Value   Color, Urine ORANGE (*)    APPearance CLOUDY (*)    Glucose, UA   (*)    Value: TEST NOT REPORTED DUE TO COLOR INTERFERENCE OF URINE PIGMENT   Hgb urine dipstick   (*)    Value: TEST NOT REPORTED DUE TO COLOR INTERFERENCE OF URINE PIGMENT   Bilirubin Urine   (*)    Value: TEST NOT REPORTED DUE TO COLOR INTERFERENCE OF URINE PIGMENT   Ketones, ur   (*)    Value: TEST NOT REPORTED DUE TO COLOR INTERFERENCE OF URINE PIGMENT   Protein, ur   (*)     Value: TEST NOT REPORTED DUE TO COLOR INTERFERENCE OF URINE PIGMENT   Nitrite   (*)    Value: TEST NOT REPORTED DUE TO COLOR INTERFERENCE OF URINE PIGMENT   Leukocytes,Ua   (*)    Value: TEST NOT REPORTED DUE TO COLOR INTERFERENCE OF URINE PIGMENT   RBC / HPF >50 (*)    WBC, UA >50 (*)    All other components within normal limits  BASIC METABOLIC PANEL - Abnormal; Notable for the following components:   Glucose, Bld 123 (*)    Creatinine, Ser 1.61 (*)    GFR, Estimated 52 (*)    All other components within normal limits  CBC   ____________________________________________  EKG  None ____________________________________________  RADIOLOGY  CT renal stone study reviewed by me ____________________________________________   PROCEDURES  Procedure(s) performed: No  Procedures   Critical Care performed: No ____________________________________________   INITIAL IMPRESSION / ASSESSMENT AND PLAN / ED COURSE  Pertinent labs & imaging results that were available during my care of the patient were reviewed by me and considered in my medical decision making (see chart for details).  Patient presents with left flank pain with radiation to left groin, no significant CVA tenderness.  Highly suspicious for ureterolithiasis given history, pending urinalysis, will treat with IV Toradol, IV Zofran, obtain CT renal stone study and reevaluate.  Patient feeling better after IV Toradol, CT scan consistent with 3 mm ureteral stone, urinalysis compromised by use of Azo however no bacteria seen. White blood cells likely related to inflammation, will put on Keflex regardless. No evidence of cough, no fever.  Appropriate discharge at this time with close follow-up with urology, return precautions discussed    ____________________________________________   FINAL CLINICAL IMPRESSION(S) / ED DIAGNOSES  Final diagnoses:  Kidney stone        Note:  This document was prepared using Dragon  voice recognition software and may include unintentional dictation errors.   Jene Every, MD 08/22/20 [phone]

## 2020-08-22 NOTE — ED Triage Notes (Signed)
First Nurse Note:  Arrives via [facility] with c/o LLQ abdominal pain x 2 days.  Has history of kidney stone.  18ga saline lock started by EMS PTA, 75 mcg Fentanyl and 4 mg Zofran given IV PTA.  VS wnl per EMS

## 2020-08-22 NOTE — ED Notes (Signed)
Patient to CT at this time

## 2020-08-22 NOTE — ED Notes (Signed)
Patient had syncopal episode while in restroom in waiting room per staff. Patient alert and oriented. Vital signs taken at that time. Per First Nurse- patient okay to come to flex.

## 2020-08-22 NOTE — ED Notes (Signed)
Pt ambulatory to the restroom at this time.  

## 2020-12-09 ENCOUNTER — Emergency Department (HOSPITAL_BASED_OUTPATIENT_CLINIC_OR_DEPARTMENT_OTHER): Payer: 59

## 2020-12-09 ENCOUNTER — Encounter (HOSPITAL_BASED_OUTPATIENT_CLINIC_OR_DEPARTMENT_OTHER): Payer: Self-pay

## 2020-12-09 ENCOUNTER — Other Ambulatory Visit: Payer: Self-pay

## 2020-12-09 ENCOUNTER — Emergency Department (HOSPITAL_BASED_OUTPATIENT_CLINIC_OR_DEPARTMENT_OTHER)
Admission: EM | Admit: 2020-12-09 | Discharge: 2020-12-09 | Disposition: A | Discharge status: Home / Self Care | Payer: 59 | Attending: Emergency Medicine | Admitting: Emergency Medicine

## 2020-12-09 DIAGNOSIS — Z96642 Presence of left artificial hip joint: Secondary | ICD-10-CM | POA: Insufficient documentation

## 2020-12-09 DIAGNOSIS — N200 Calculus of kidney: Secondary | ICD-10-CM

## 2020-12-09 DIAGNOSIS — N202 Calculus of kidney with calculus of ureter: Secondary | ICD-10-CM | POA: Insufficient documentation

## 2020-12-09 DIAGNOSIS — R1013 Epigastric pain: Secondary | ICD-10-CM | POA: Diagnosis present

## 2020-12-09 DIAGNOSIS — N182 Chronic kidney disease, stage 2 (mild): Secondary | ICD-10-CM | POA: Insufficient documentation

## 2020-12-09 LAB — CBC WITH DIFFERENTIAL/PLATELET
Abs Immature Granulocytes: 0.02 10*3/uL (ref 0.00–0.07)
Basophils Absolute: 0 10*3/uL (ref 0.0–0.1)
Basophils Relative: 0 %
Eosinophils Absolute: 0 10*3/uL (ref 0.0–0.5)
Eosinophils Relative: 0 %
HCT: 51 % (ref 39.0–52.0)
Hemoglobin: 17.2 g/dL — ABNORMAL HIGH (ref 13.0–17.0)
Immature Granulocytes: 0 %
Lymphocytes Relative: 27 %
Lymphs Abs: 2.2 10*3/uL (ref 0.7–4.0)
MCH: 30 pg (ref 26.0–34.0)
MCHC: 33.7 g/dL (ref 30.0–36.0)
MCV: 89 fL (ref 80.0–100.0)
Monocytes Absolute: 0.8 10*3/uL (ref 0.1–1.0)
Monocytes Relative: 10 %
Neutro Abs: 5.2 10*3/uL (ref 1.7–7.7)
Neutrophils Relative %: 63 %
Platelets: 213 10*3/uL (ref 150–400)
RBC: 5.73 MIL/uL (ref 4.22–5.81)
RDW: 12.6 % (ref 11.5–15.5)
WBC: 8.2 10*3/uL (ref 4.0–10.5)
nRBC: 0 % (ref 0.0–0.2)

## 2020-12-09 LAB — COMPREHENSIVE METABOLIC PANEL
ALT: 26 U/L (ref 0–44)
AST: 21 U/L (ref 15–41)
Albumin: 4.5 g/dL (ref 3.5–5.0)
Alkaline Phosphatase: 56 U/L (ref 38–126)
Anion gap: 9 (ref 5–15)
BUN: 16 mg/dL (ref 6–20)
CO2: 27 mmol/L (ref 22–32)
Calcium: 9.5 mg/dL (ref 8.9–10.3)
Chloride: 102 mmol/L (ref 98–111)
Creatinine, Ser: 1.71 mg/dL — ABNORMAL HIGH (ref 0.61–1.24)
GFR, Estimated: 49 mL/min — ABNORMAL LOW (ref >60)
Glucose, Bld: 123 mg/dL — ABNORMAL HIGH (ref 70–99)
Potassium: 3.6 mmol/L (ref 3.5–5.1)
Sodium: 138 mmol/L (ref 135–145)
Total Bilirubin: 0.9 mg/dL (ref 0.3–1.2)
Total Protein: 7.8 g/dL (ref 6.5–8.1)

## 2020-12-09 LAB — URINALYSIS, ROUTINE W REFLEX MICROSCOPIC
Bilirubin Urine: NEGATIVE
Glucose, UA: 250 mg/dL — AB
Ketones, ur: NEGATIVE mg/dL
Nitrite: POSITIVE — AB
Protein, ur: >300 mg/dL — AB
Specific Gravity, Urine: >1.03 — ABNORMAL HIGH (ref 1.005–1.030)
pH: 5 (ref 5.0–8.0)

## 2020-12-09 LAB — URINALYSIS, MICROSCOPIC (REFLEX): RBC / HPF: >50 RBC/hpf (ref 0–5)

## 2020-12-09 LAB — LIPASE, BLOOD: Lipase: 22 U/L (ref 11–51)

## 2020-12-09 MED ORDER — TAMSULOSIN HCL 0.4 MG PO CAPS: 0.4000 mg | ORAL_CAPSULE | Freq: Every day | ORAL | 0 refills | Status: AC

## 2020-12-09 MED ORDER — OXYCODONE HCL 5 MG PO TABS: 5.0000 mg | ORAL_TABLET | Freq: Four times a day (QID) | ORAL | 0 refills | Status: DC | PRN

## 2020-12-09 MED ORDER — KETOROLAC TROMETHAMINE 30 MG/ML IJ SOLN
30.0000 mg | Freq: Once | INTRAMUSCULAR | Status: AC
  Administered 2020-12-09: 30 mg via INTRAVENOUS
  Filled 2020-12-09: qty 1

## 2020-12-09 MED ORDER — ONDANSETRON HCL 4 MG/2ML IJ SOLN
4.0000 mg | Freq: Once | INTRAMUSCULAR | Status: AC
  Administered 2020-12-09: 4 mg via INTRAVENOUS
  Filled 2020-12-09: qty 2

## 2020-12-09 MED ORDER — CEPHALEXIN 500 MG PO CAPS: 500.0000 mg | ORAL_CAPSULE | Freq: Two times a day (BID) | ORAL | 0 refills | Status: AC

## 2020-12-09 MED ORDER — ONDANSETRON HCL 4 MG PO TABS: 4.0000 mg | ORAL_TABLET | Freq: Three times a day (TID) | ORAL | 0 refills | Status: AC | PRN

## 2020-12-09 MED ORDER — HYDROMORPHONE HCL 1 MG/ML IJ SOLN
1.0000 mg | Freq: Once | INTRAMUSCULAR | Status: AC
  Administered 2020-12-09: 1 mg via INTRAVENOUS
  Filled 2020-12-09: qty 1

## 2020-12-09 MED ORDER — CEPHALEXIN 250 MG PO CAPS
500.0000 mg | ORAL_CAPSULE | Freq: Once | ORAL | Status: AC
  Administered 2020-12-09: 500 mg via ORAL
  Filled 2020-12-09: qty 2

## 2020-12-09 MED ORDER — SODIUM CHLORIDE 0.9 % IV BOLUS
1000.0000 mL | Freq: Once | INTRAVENOUS | Status: AC
  Administered 2020-12-09: 1000 mL via INTRAVENOUS

## 2020-12-09 NOTE — ED Notes (Signed)
Patient transported to CT 

## 2020-12-09 NOTE — Discharge Instructions (Signed)
Call your urologist and try to make a sooner appointment.  As discussed if you develop fever, uncontrollable pain, nausea, vomiting please return to emergency department for reevaluation.

## 2020-12-09 NOTE — ED Notes (Signed)
Pt resting comfortably at this time, aware of need for urine specimen

## 2020-12-09 NOTE — ED Notes (Signed)
ED Provider at bedside. 

## 2020-12-09 NOTE — ED Provider Notes (Signed)
[facility] EMERGENCY DEPARTMENT Provider Note   CSN: [id number] Arrival date & time: 12/09/20  1515     History Chief Complaint  Patient presents with  . Flank Pain    Chris Williams is a 50 y.o. male.  Left flank pain for 2 days.  Feels like his kidney stones.  Having some pain in the epigastric into his chest but pain radiates into his left groin.  The history is provided by the patient.  Flank Pain This is a new problem. The current episode started 2 days ago. Episode frequency: waxing and waning. Pertinent negatives include no chest pain, no abdominal pain, no headaches and no shortness of breath. Nothing aggravates the symptoms. Nothing relieves the symptoms. He has tried nothing for the symptoms. The treatment provided no relief.       Past Medical History:  Diagnosis Date  . Anxiety   . Arthritis    hip  . History of kidney stones   . Nausea without vomiting 05/12/2019    Patient Active Problem List   Diagnosis Date Noted  . Nausea without vomiting 05/12/2019  . Obese 04/01/2019  . Asthma, extrinsic, moderate persistent, uncomplicated 04/07/2018  . CKD (chronic kidney disease) stage 2, GFR 60-89 ml/min 02/18/2018  . Nephrolithiasis 01/21/2018    Past Surgical History:  Procedure Laterality Date  . CYSTOSCOPY/RETROGRADE/URETEROSCOPY/STONE EXTRACTION WITH BASKET Right 09/29/2012   Procedure: CYSTOSCOPY/RETROGRADE/URETEROSCOPY/STONE EXTRACTION WITH BASKET;  Surgeon: Anner Crete, MD;  Location: [facility];  Service: Urology;  Laterality: Right;  . HOLMIUM LASER APPLICATION Right 09/29/2012   Procedure: HOLMIUM LASER APPLICATION;  Surgeon: Anner Crete, MD;  Location: [facility];  Service: Urology;  Laterality: Right;  . TOTAL HIP ARTHROPLASTY Left 03/31/2019   Procedure: TOTAL HIP ARTHROPLASTY ANTERIOR APPROACH;  Surgeon: Durene Romans, MD;  Location: [facility];  Service: Orthopedics;  Laterality: Left;  70 mins       Family History  Problem Relation Age of Onset   . Hypertension Father   . CAD Other   . Deep vein thrombosis Other   . Coronary artery disease Other   . Hypertension Mother   . Cancer Maternal Grandmother   . Hyperlipidemia Maternal Grandmother   . Stroke Maternal Grandmother   . Cancer Maternal Grandfather   . Hyperlipidemia Maternal Grandfather   . Stroke Maternal Grandfather   . Cancer Paternal Grandmother   . Hyperlipidemia Paternal Grandmother   . Cancer Paternal Grandfather   . Hyperlipidemia Paternal Grandfather     Social History   Tobacco Use  . Smoking status: Never Smoker  . Smokeless tobacco: Never Used  Vaping Use  . Vaping Use: Never used  Substance Use Topics  . Alcohol use: No  . Drug use: No    Home Medications Prior to Admission medications   Medication Sig Start Date End Date Taking? Authorizing Provider  cephALEXin (KEFLEX) 500 MG capsule Take 1 capsule (500 mg total) by mouth 2 (two) times daily for 10 days. 12/09/20 12/19/20 Yes Tenzin Pavon, DO  ondansetron (ZOFRAN) 4 MG tablet Take 1 tablet (4 mg total) by mouth every 8 (eight) hours as needed for up to 20 doses for nausea or vomiting. 12/09/20  Yes Sharmane Dame, DO  oxyCODONE (ROXICODONE) 5 MG immediate release tablet Take 1 tablet (5 mg total) by mouth every 6 (six) hours as needed for up to 20 doses for severe pain or breakthrough pain. 12/09/20  Yes Gauri Galvao, DO  tamsulosin (FLOMAX) 0.4 MG CAPS capsule Take  1 capsule (0.4 mg total) by mouth daily for 7 days. 12/09/20 12/16/20 Yes Rhina Kramme, DO  albuterol (PROVENTIL HFA;VENTOLIN HFA) 108 (90 Base) MCG/ACT inhaler Inhale 2 puffs into the lungs every 4 (four) hours as needed for wheezing or shortness of breath (cough, shortness of breath or wheezing.). 01/27/18   Benjiman Core D, PA-C  albuterol (PROVENTIL) (2.5 MG/3ML) 0.083% nebulizer solution Take 3 mLs (2.5 mg total) by nebulization every 6 (six) hours as needed for wheezing or shortness of breath. 01/27/18   Benjiman Core D, PA-C   docusate sodium (COLACE) 100 MG capsule Take 1 capsule (100 mg total) by mouth 2 (two) times daily. Patient not taking: Reported on 05/12/2019 04/01/19   Lanney Gins, PA-C  ferrous sulfate (FERROUSUL) 325 (65 FE) MG tablet Take 1 tablet (325 mg total) by mouth 3 (three) times daily with meals for 14 days. 04/01/19 04/15/19  Lanney Gins, PA-C  fluticasone (FLONASE) 50 MCG/ACT nasal spray Place 1 spray into both nostrils daily for 5 days. 03/17/20 03/22/20  Sabas Sous, MD  Fluticasone Furoate (ARNUITY ELLIPTA) 200 MCG/ACT AEPB Inhale 1 puff into the lungs daily. Patient not taking: Reported on 05/12/2019 04/07/18   Glenford Bayley, NP  HYDROcodone-acetaminophen (NORCO) 7.5-325 MG tablet Take 1-2 tablets by mouth every 4 (four) hours as needed for moderate pain. Patient not taking: Reported on 05/12/2019 04/01/19   Lanney Gins, PA-C  hydrOXYzine (ATARAX/VISTARIL) 25 MG tablet Take 0.5-1 tablets (12.5-25 mg total) by mouth every 8 (eight) hours as needed. Patient not taking: Reported on 05/12/2019 01/27/18   Benjiman Core D, PA-C  methocarbamol (ROBAXIN) 500 MG tablet Take 1 tablet (500 mg total) by mouth every 6 (six) hours as needed for muscle spasms. Patient not taking: Reported on 05/12/2019 04/01/19   Lanney Gins, PA-C  naproxen (NAPROSYN) 500 MG tablet Take 1 tablet (500 mg total) by mouth 2 (two) times daily with a meal. 08/22/20   Jene Every, MD  ondansetron (ZOFRAN ODT) 4 MG disintegrating tablet Take 1 tablet (4 mg total) by mouth every 8 (eight) hours as needed. 08/22/20   Jene Every, MD  oxyCODONE-acetaminophen (PERCOCET) 5-325 MG tablet Take 1 tablet by mouth every 6 (six) hours as needed for severe pain. 08/22/20 08/22/21  Jene Every, MD  polyethylene glycol (MIRALAX / GLYCOLAX) 17 g packet Take 17 g by mouth 2 (two) times daily. Patient not taking: Reported on 05/12/2019 04/01/19   Lanney Gins, PA-C    Allergies    Patient has no known allergies.  Review of  Systems   Review of Systems  Constitutional: Negative for chills and fever.  HENT: Negative for ear pain and sore throat.   Eyes: Negative for pain and visual disturbance.  Respiratory: Negative for cough and shortness of breath.   Cardiovascular: Negative for chest pain and palpitations.  Gastrointestinal: Negative for abdominal pain and vomiting.  Genitourinary: Positive for flank pain and hematuria. Negative for decreased urine volume, dysuria, penile pain, penile swelling, scrotal swelling, testicular pain and urgency.  Musculoskeletal: Negative for arthralgias and back pain.  Skin: Negative for color change and rash.  Neurological: Negative for seizures, syncope and headaches.  All other systems reviewed and are negative.   Physical Exam Updated Vital Signs BP 121/89 (BP Location: Left Arm)   Pulse 73   Temp 99.2 F (37.3 C) (Oral)   Resp 16   Ht 6\' 1"  (1.854 m)   Wt 113.4 kg   SpO2 100%   BMI 32.98 kg/m  Physical Exam Vitals and nursing note reviewed.  Constitutional:      Appearance: He is well-developed. He is ill-appearing.  HENT:     Head: Normocephalic and atraumatic.     Nose: Nose normal.     Mouth/Throat:     Mouth: Mucous membranes are moist.  Eyes:     Extraocular Movements: Extraocular movements intact.     Conjunctiva/sclera: Conjunctivae normal.     Pupils: Pupils are equal, round, and reactive to light.  Cardiovascular:     Rate and Rhythm: Normal rate and regular rhythm.     Pulses: Normal pulses.     Heart sounds: Normal heart sounds. No murmur heard.   Pulmonary:     Effort: Pulmonary effort is normal. No respiratory distress.     Breath sounds: Normal breath sounds.  Abdominal:     Palpations: Abdomen is soft.     Tenderness: There is no abdominal tenderness.  Musculoskeletal:        General: Tenderness (TTP left flank) present. Normal range of motion.     Cervical back: Neck supple.  Skin:    General: Skin is warm and dry.      Capillary Refill: Capillary refill takes less than 2 seconds.  Neurological:     General: No focal deficit present.     Mental Status: He is alert.     ED Results / Procedures / Treatments   Labs (all labs ordered are listed, but only abnormal results are displayed) Labs Reviewed  CBC WITH DIFFERENTIAL/PLATELET - Abnormal; Notable for the following components:      Result Value   Hemoglobin 17.2 (*)    All other components within normal limits  COMPREHENSIVE METABOLIC PANEL - Abnormal; Notable for the following components:   Glucose, Bld 123 (*)    Creatinine, Ser 1.71 (*)    GFR, Estimated 49 (*)    All other components within normal limits  URINALYSIS, ROUTINE W REFLEX MICROSCOPIC - Abnormal; Notable for the following components:   Color, Urine AMBER (*)    APPearance CLOUDY (*)    Specific Gravity, Urine >1.030 (*)    Glucose, UA 250 (*)    Hgb urine dipstick LARGE (*)    Protein, ur >300 (*)    Nitrite POSITIVE (*)    Leukocytes,Ua TRACE (*)    All other components within normal limits  URINALYSIS, MICROSCOPIC (REFLEX) - Abnormal; Notable for the following components:   Bacteria, UA MANY (*)    All other components within normal limits  URINE CULTURE  LIPASE, BLOOD    EKG EKG Interpretation  Date/Time:  Friday December 09 2020 15:32:08 EDT Ventricular Rate:  73 PR Interval:  146 QRS Duration: 84 QT Interval:  400 QTC Calculation: 440 R Axis:   31 Text Interpretation: Normal sinus rhythm Normal ECG Confirmed by Virgina Norfolk ([id number]) on 12/09/2020 3:46:13 PM   Radiology CT Renal Stone Study  Result Date: 12/09/2020 CLINICAL DATA:  Left flank pain. EXAM: CT ABDOMEN AND PELVIS WITHOUT CONTRAST TECHNIQUE: Multidetector CT imaging of the abdomen and pelvis was performed following the standard protocol without IV contrast. COMPARISON:  August 22, 2020 FINDINGS: Lower chest: No acute abnormality. Hepatobiliary: No focal liver abnormality is seen. No gallstones, gallbladder  wall thickening, or biliary dilatation. Pancreas: Unremarkable. No pancreatic ductal dilatation or surrounding inflammatory changes. Spleen: Normal in size without focal abnormality. Adrenals/Urinary Tract: Adrenal glands are unremarkable. Kidneys are normal in size, without focal lesions. Numerous 1 mm and 2 mm nonobstructing renal  stones are seen within the right kidney. 2 mm, 3 mm and 4 mm nonobstructing renal stones are seen within the left kidney. A 4 mm obstructing renal stone is seen within the distal left ureter (axial CT image 75, CT series number 2), with mild left-sided hydronephrosis and hydroureter. Bladder is unremarkable. Stomach/Bowel: Stomach is within normal limits. Appendix appears normal. No evidence of bowel wall thickening, distention, or inflammatory changes. Vascular/Lymphatic: No significant vascular findings are present. No enlarged abdominal or pelvic lymph nodes. Reproductive: Prostate is unremarkable. Other: No abdominal wall hernia or abnormality. No abdominopelvic ascites. Musculoskeletal: A total left hip replacement is seen with associated streak artifact. No acute osseous abnormalities are identified. IMPRESSION: 1. 4 mm obstructing renal stone within the distal left ureter. 2. Numerous bilateral subcentimeter non-obstructing renal stones. Electronically Signed   By: Aram Candelahaddeus  Houston M.D.   On: 12/09/2020 16:50    Procedures Procedures   Medications Ordered in ED Medications  cephALEXin (KEFLEX) capsule 500 mg (has no administration in time range)  sodium chloride 0.9 % bolus 1,000 mL (0 mLs Intravenous Stopped 12/09/20 1826)  HYDROmorphone (DILAUDID) injection 1 mg (1 mg Intravenous Given 12/09/20 1615)  ondansetron (ZOFRAN) injection 4 mg (4 mg Intravenous Given 12/09/20 1612)  ketorolac (TORADOL) 30 MG/ML injection 30 mg (30 mg Intravenous Given 12/09/20 1724)  ondansetron (ZOFRAN) injection 4 mg (4 mg Intravenous Given 12/09/20 1723)  sodium chloride 0.9 % bolus 1,000 mL  (1,000 mLs Intravenous New Bag/Given 12/09/20 1839)    ED Course  I have reviewed the triage vital signs and the nursing notes.  Pertinent labs & imaging results that were available during my care of the patient were reviewed by me and considered in my medical decision making (see chart for details).    MDM Rules/Calculators/A&P                          Chris Williams is a 50 year old male who presents the ED with left flank pain.  History of kidney stones.  Feels like the same.  Has hematuria.  On and off the last 2 days.  Tenderness in the left flank.  Concern for pyelonephritis versus kidney stone versus pancreatitis versus gastritis.  We will get basic labs including CT scan abdomen and pelvis.  Will give IV Dilaudid, IV Zofran, normal saline bolus and reevaluate.  CT scan shows 4 mm obstructing renal stone within the distal left ureter.  Overall creatinine is at baseline.  Urinalysis overall equivocal for infection but he has no fever no white count but will conservatively start antibiotic.  Felt much better after IV fluids and pain medication.  No fever on repeat.  Overall suspect uncomplicated kidney stone.  He understands return precautions and recommend that he follow-up with his urologist.  Discharged in good condition.  This chart was dictated using voice recognition software.  Despite best efforts to proofread,  errors can occur which can change the documentation meaning.   Final Clinical Impression(s) / ED Diagnoses Final diagnoses:  Kidney stone    Rx / DC Orders ED Discharge Orders         Ordered    oxyCODONE (ROXICODONE) 5 MG immediate release tablet  Every 6 hours PRN        12/09/20 1938    cephALEXin (KEFLEX) 500 MG capsule  2 times daily        12/09/20 1938    ondansetron (ZOFRAN) 4 MG tablet  Every 8  hours PRN        12/09/20 1938    tamsulosin (FLOMAX) 0.4 MG CAPS capsule  Daily        12/09/20 1938           Virgina Norfolk, DO 12/09/20 1940

## 2020-12-09 NOTE — ED Notes (Signed)
Pt drowsy following dilaudid, sat 88% rm air.  Oxygen applied 2L, sat 97%

## 2020-12-09 NOTE — ED Triage Notes (Addendum)
Pt c/o left flank pain x 2 days that he states feels like kidney stone pain-also c/o central CP x 2 days-NAD-steady gait

## 2020-12-09 NOTE — ED Notes (Signed)
Waiting on bloodwork and meds prior to imaging

## 2020-12-11 LAB — URINE CULTURE: Culture: NO GROWTH

## 2020-12-12 ENCOUNTER — Emergency Department (HOSPITAL_COMMUNITY): Payer: 59

## 2020-12-12 ENCOUNTER — Other Ambulatory Visit: Payer: Self-pay

## 2020-12-12 ENCOUNTER — Encounter (HOSPITAL_COMMUNITY): Payer: Self-pay

## 2020-12-12 ENCOUNTER — Emergency Department (HOSPITAL_COMMUNITY)
Admission: EM | Admit: 2020-12-12 | Discharge: 2020-12-12 | Disposition: A | Discharge status: Home / Self Care | Payer: 59 | Attending: Emergency Medicine | Admitting: Emergency Medicine

## 2020-12-12 DIAGNOSIS — Z96642 Presence of left artificial hip joint: Secondary | ICD-10-CM | POA: Insufficient documentation

## 2020-12-12 DIAGNOSIS — J4540 Moderate persistent asthma, uncomplicated: Secondary | ICD-10-CM | POA: Diagnosis not present

## 2020-12-12 DIAGNOSIS — Z7951 Long term (current) use of inhaled steroids: Secondary | ICD-10-CM | POA: Insufficient documentation

## 2020-12-12 DIAGNOSIS — N200 Calculus of kidney: Secondary | ICD-10-CM

## 2020-12-12 DIAGNOSIS — N202 Calculus of kidney with calculus of ureter: Secondary | ICD-10-CM | POA: Insufficient documentation

## 2020-12-12 DIAGNOSIS — R1012 Left upper quadrant pain: Secondary | ICD-10-CM | POA: Diagnosis present

## 2020-12-12 DIAGNOSIS — N182 Chronic kidney disease, stage 2 (mild): Secondary | ICD-10-CM | POA: Insufficient documentation

## 2020-12-12 LAB — CBC WITH DIFFERENTIAL/PLATELET
Abs Immature Granulocytes: 0.03 10*3/uL (ref 0.00–0.07)
Basophils Absolute: 0 10*3/uL (ref 0.0–0.1)
Basophils Relative: 0 %
Eosinophils Absolute: 0 10*3/uL (ref 0.0–0.5)
Eosinophils Relative: 0 %
HCT: 44.3 % (ref 39.0–52.0)
Hemoglobin: 14.8 g/dL (ref 13.0–17.0)
Immature Granulocytes: 0 %
Lymphocytes Relative: 15 %
Lymphs Abs: 1.5 10*3/uL (ref 0.7–4.0)
MCH: 30.1 pg (ref 26.0–34.0)
MCHC: 33.4 g/dL (ref 30.0–36.0)
MCV: 90.2 fL (ref 80.0–100.0)
Monocytes Absolute: 0.7 10*3/uL (ref 0.1–1.0)
Monocytes Relative: 8 %
Neutro Abs: 7.4 10*3/uL (ref 1.7–7.7)
Neutrophils Relative %: 77 %
Platelets: 189 10*3/uL (ref 150–400)
RBC: 4.91 MIL/uL (ref 4.22–5.81)
RDW: 12.4 % (ref 11.5–15.5)
WBC: 9.6 10*3/uL (ref 4.0–10.5)
nRBC: 0 % (ref 0.0–0.2)

## 2020-12-12 LAB — BASIC METABOLIC PANEL
Anion gap: 9 (ref 5–15)
BUN: 13 mg/dL (ref 6–20)
CO2: 25 mmol/L (ref 22–32)
Calcium: 9.1 mg/dL (ref 8.9–10.3)
Chloride: 104 mmol/L (ref 98–111)
Creatinine, Ser: 1.54 mg/dL — ABNORMAL HIGH (ref 0.61–1.24)
GFR, Estimated: 55 mL/min — ABNORMAL LOW (ref >60)
Glucose, Bld: 125 mg/dL — ABNORMAL HIGH (ref 70–99)
Potassium: 3.5 mmol/L (ref 3.5–5.1)
Sodium: 138 mmol/L (ref 135–145)

## 2020-12-12 LAB — URINALYSIS, ROUTINE W REFLEX MICROSCOPIC
Bilirubin Urine: NEGATIVE
Glucose, UA: NEGATIVE mg/dL
Ketones, ur: NEGATIVE mg/dL
Leukocytes,Ua: NEGATIVE
Nitrite: POSITIVE — AB
Protein, ur: 100 mg/dL — AB
RBC / HPF: >50 RBC/hpf — ABNORMAL HIGH (ref 0–5)
Specific Gravity, Urine: 1.012 (ref 1.005–1.030)
pH: 5 (ref 5.0–8.0)

## 2020-12-12 MED ORDER — OXYCODONE-ACETAMINOPHEN 5-325 MG PO TABS: 1.0000 | ORAL_TABLET | Freq: Four times a day (QID) | ORAL | 0 refills | Status: DC | PRN

## 2020-12-12 MED ORDER — KETOROLAC TROMETHAMINE 15 MG/ML IJ SOLN
15.0000 mg | Freq: Once | INTRAMUSCULAR | Status: AC
  Administered 2020-12-12: 15 mg via INTRAVENOUS
  Filled 2020-12-12: qty 1

## 2020-12-12 MED ORDER — SODIUM CHLORIDE 0.9 % IV BOLUS
1000.0000 mL | Freq: Once | INTRAVENOUS | Status: AC
  Administered 2020-12-12: 1000 mL via INTRAVENOUS

## 2020-12-12 NOTE — ED Notes (Signed)
Patient transported to CT 

## 2020-12-12 NOTE — ED Notes (Signed)
Brought pt snack and drink 

## 2020-12-12 NOTE — ED Provider Notes (Signed)
[facility]-EMERGENCY DEPT Provider Note   CSN: [id number] Arrival date & time: 12/12/20  1132     History Chief Complaint  Patient presents with  . Abdominal Pain    Chris Williams is a 50 y.o. male who presents to the ED today with complaint of ongoing LLQ abdominal pain x 5 days. Pt was seen at [facility] ED on 06/03 for same and found to have a 4 mm obstructing renal stone within the distal left ureter. His urine was positive for leuks and nitrites and he was discharged with oral keflex. Pt was also discharged home with pain meds, flomax, and nausea meds. He reports the pain medicine was working somewhat up until this morning when he had no relief. He has been urinating normally and peeing into a strainer however has not noticed any stone passing. Pt reports the pain was so severe this AM prompting him to call EMS. He reports that about 8 years ago he had a stone that required surgical removal. Pt denies fevers or chills. No other complaints.   The history is provided by the patient and medical records.       Past Medical History:  Diagnosis Date  . Anxiety   . Arthritis    hip  . History of kidney stones   . Nausea without vomiting 05/12/2019    Patient Active Problem List   Diagnosis Date Noted  . Nausea without vomiting 05/12/2019  . Obese 04/01/2019  . Asthma, extrinsic, moderate persistent, uncomplicated 04/07/2018  . CKD (chronic kidney disease) stage 2, GFR 60-89 ml/min 02/18/2018  . Nephrolithiasis 01/21/2018    Past Surgical History:  Procedure Laterality Date  . CYSTOSCOPY/RETROGRADE/URETEROSCOPY/STONE EXTRACTION WITH BASKET Right 09/29/2012   Procedure: CYSTOSCOPY/RETROGRADE/URETEROSCOPY/STONE EXTRACTION WITH BASKET;  Surgeon: Anner CreteJohn J Wrenn, MD;  Location: [facility];  Service: Urology;  Laterality: Right;  . HOLMIUM LASER APPLICATION Right 09/29/2012   Procedure: HOLMIUM LASER APPLICATION;  Surgeon: Anner CreteJohn J Wrenn, MD;  Location: [facility];  Service: Urology;   Laterality: Right;  . 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 Hypertension Father   . CAD Other   . Deep vein thrombosis Other   . Coronary artery disease Other   . Hypertension Mother   . Cancer Maternal Grandmother   . Hyperlipidemia Maternal Grandmother   . Stroke Maternal Grandmother   . Cancer Maternal Grandfather   . Hyperlipidemia Maternal Grandfather   . Stroke Maternal Grandfather   . Cancer Paternal Grandmother   . Hyperlipidemia Paternal Grandmother   . Cancer Paternal Grandfather   . Hyperlipidemia Paternal Grandfather     Social History   Tobacco Use  . Smoking status: Never Smoker  . Smokeless tobacco: Never Used  Vaping Use  . Vaping Use: Never used  Substance Use Topics  . Alcohol use: No  . Drug use: No    Home Medications Prior to Admission medications   Medication Sig Start Date End Date Taking? Authorizing Provider  albuterol (PROVENTIL HFA;VENTOLIN HFA) 108 (90 Base) MCG/ACT inhaler Inhale 2 puffs into the lungs every 4 (four) hours as needed for wheezing or shortness of breath (cough, shortness of breath or wheezing.). 01/27/18   Benjiman CoreWiseman, Brittany D, PA-C  albuterol (PROVENTIL) (2.5 MG/3ML) 0.083% nebulizer solution Take 3 mLs (  2.5 mg total) by nebulization every 6 (six) hours as needed for wheezing or shortness of breath. 01/27/18   Benjiman Core D, PA-C  cephALEXin (KEFLEX) 500 MG capsule Take 1 capsule (500 mg total) by mouth 2 (two) times daily for 10 days. 12/09/20 12/19/20  Curatolo, Adam, DO  docusate sodium (COLACE) 100 MG capsule Take 1 capsule (100 mg total) by mouth 2 (two) times daily. Patient not taking: Reported on 05/12/2019 04/01/19   Lanney Gins, PA-C  ferrous sulfate (FERROUSUL) 325 (65 FE) MG tablet Take 1 tablet (325 mg  total) by mouth 3 (three) times daily with meals for 14 days. 04/01/19 04/15/19  Lanney Gins, PA-C  fluticasone (FLONASE) 50 MCG/ACT nasal spray Place 1 spray into both nostrils daily for 5 days. 03/17/20 03/22/20  Sabas Sous, MD  Fluticasone Furoate (ARNUITY ELLIPTA) 200 MCG/ACT AEPB Inhale 1 puff into the lungs daily. Patient not taking: Reported on 05/12/2019 04/07/18   Glenford Bayley, NP  HYDROcodone-acetaminophen (NORCO) 7.5-325 MG tablet Take 1-2 tablets by mouth every 4 (four) hours as needed for moderate pain. Patient not taking: Reported on 05/12/2019 04/01/19   Lanney Gins, PA-C  hydrOXYzine (ATARAX/VISTARIL) 25 MG tablet Take 0.5-1 tablets (12.5-25 mg total) by mouth every 8 (eight) hours as needed. Patient not taking: Reported on 05/12/2019 01/27/18   Benjiman Core D, PA-C  methocarbamol (ROBAXIN) 500 MG tablet Take 1 tablet (500 mg total) by mouth every 6 (six) hours as needed for muscle spasms. Patient not taking: Reported on 05/12/2019 04/01/19   Lanney Gins, PA-C  naproxen (NAPROSYN) 500 MG tablet Take 1 tablet (500 mg total) by mouth 2 (two) times daily with a meal. 08/22/20   Jene Every, MD  ondansetron (ZOFRAN ODT) 4 MG disintegrating tablet Take 1 tablet (4 mg total) by mouth every 8 (eight) hours as needed. 08/22/20   Jene Every, MD  ondansetron (ZOFRAN) 4 MG tablet Take 1 tablet (4 mg total) by mouth every 8 (eight) hours as needed for up to 20 doses for nausea or vomiting. 12/09/20   Curatolo, Adam, DO  oxyCODONE (ROXICODONE) 5 MG immediate release tablet Take 1 tablet (5 mg total) by mouth every 6 (six) hours as needed for up to 20 doses for severe pain or breakthrough pain. 12/09/20   Curatolo, Adam, DO  oxyCODONE-acetaminophen (PERCOCET) 5-325 MG tablet Take 1 tablet by mouth every 6 (six) hours as needed for severe pain. 08/22/20 08/22/21  Jene Every, MD  polyethylene glycol (MIRALAX / GLYCOLAX) 17 g packet Take 17 g by mouth 2 (two) times daily. Patient not  taking: Reported on 05/12/2019 04/01/19   Lanney Gins, PA-C  tamsulosin (FLOMAX) 0.4 MG CAPS capsule Take 1 capsule (0.4 mg total) by mouth daily for 7 days. 12/09/20 12/16/20  Virgina Norfolk, DO    Allergies    Patient has no known allergies.  Review of Systems   Review of Systems  Constitutional: Negative for chills and fever.  Gastrointestinal: Positive for abdominal pain and nausea. Negative for diarrhea and vomiting.  Genitourinary: Positive for flank pain.  All other systems reviewed and are negative.   Physical Exam Updated Vital Signs BP (!) 129/98   Pulse 74   Temp 98.8 F (37.1 C) (Oral)   Resp 14   SpO2 99%   Physical Exam Vitals and nursing note reviewed.  Constitutional:      Appearance: He is not ill-appearing.  HENT:     Head: Normocephalic and atraumatic.  Eyes:  Conjunctiva/sclera: Conjunctivae normal.  Cardiovascular:     Rate and Rhythm: Normal rate and regular rhythm.     Heart sounds: Normal heart sounds.  Pulmonary:     Effort: Pulmonary effort is normal.     Breath sounds: Normal breath sounds. No wheezing, rhonchi or rales.  Abdominal:     Palpations: Abdomen is soft.     Tenderness: There is abdominal tenderness in the left lower quadrant. There is left CVA tenderness. There is no right CVA tenderness, guarding or rebound.  Musculoskeletal:     Cervical back: Neck supple.  Skin:    General: Skin is warm and dry.  Neurological:     Mental Status: He is alert.     ED Results / Procedures / Treatments   Labs (all labs ordered are listed, but only abnormal results are displayed) Labs Reviewed  BASIC METABOLIC PANEL - Abnormal; Notable for the following components:      Result Value   Glucose, Bld 125 (*)    Creatinine, Ser 1.54 (*)    GFR, Estimated 55 (*)    All other components within normal limits  URINALYSIS, ROUTINE W REFLEX MICROSCOPIC - Abnormal; Notable for the following components:   Color, Urine AMBER (*)    APPearance  CLOUDY (*)    Hgb urine dipstick LARGE (*)    Protein, ur 100 (*)    Nitrite POSITIVE (*)    RBC / HPF >50 (*)    Bacteria, UA RARE (*)    All other components within normal limits  CBC WITH DIFFERENTIAL/PLATELET    EKG None  Radiology CT Renal Stone Study  Result Date: 12/12/2020 CLINICAL DATA:  Bilateral flank pain. EXAM: CT ABDOMEN AND PELVIS WITHOUT CONTRAST TECHNIQUE: Multidetector CT imaging of the abdomen and pelvis was performed following the standard protocol without IV contrast. COMPARISON:  CT 12/09/2020 FINDINGS: Lower chest: Lung bases are clear. Hepatobiliary: No focal hepatic lesion. No biliary duct dilatation. Common bile duct is normal. Pancreas: Pancreas is normal. No ductal dilatation. No pancreatic inflammation. Spleen: Normal spleen Adrenals/urinary tract: Adrenal glands normal. Mild hydro ureter hydronephrosis of the LEFT renal collecting system. New proximal LEFT renal calculus measures 6 mm on image 50/2. This calculus is at the L2 vertebral body level. The previous seen distal LEFT renal calculus has migrated to the LEFT vesicoureteral junction measuring 6 mm on image 85/2. Single nonobstructing LEFT renal calculus remains. Three nonobstructing RIGHT renal calculi again noted. No bladder calculi Stomach/Bowel: Stomach, small bowel, appendix, and cecum are normal. The colon and rectosigmoid colon are normal. Vascular/Lymphatic: Abdominal aorta is normal caliber. No periportal or retroperitoneal adenopathy. No pelvic adenopathy. Reproductive: Prostate unremarkable Other: No free fluid. Musculoskeletal: No aggressive osseous lesion. IMPRESSION: 1. Migration of the distal LEFT ureteral calculus to the LEFT vesico ureteral junction when compared to CT 12/09/2020. 2. New LEFT ureteral calculus in the mid LEFT ureter. 3. Moderate LEFT hydronephrosis and hydroureter. 4. Several nonobstructing RIGHT renal calculus and a single nonobstructing LEFT renal calculus. Electronically Signed    By: Genevive Bi M.D.   On: 12/12/2020 15:11    Procedures Procedures   Medications Ordered in ED Medications  sodium chloride 0.9 % bolus 1,000 mL (0 mLs Intravenous Stopped 12/12/20 1409)    ED Course  I have reviewed the triage vital signs and the nursing notes.  Pertinent labs & imaging results that were available during my care of the patient were reviewed by me and considered in my medical decision making (see  chart for details).    MDM Rules/Calculators/A&P                          50 year old male who presents to the ED today with persistent ongoing left flank/left lower quadrant abdominal pain for the past 5 days.  Seen in the ED 3 days ago and diagnosed with a obstructing 4 mm stone at the left distal ureter.  Discharged home with close outpatient urology follow-up.  Does appear his urine appeared infectious at that time however he did not have any systemic symptoms and was discharged with oral Keflex.  He reports ongoing pain, uncontrolled with pain medication today prompting further ED visit.  Does not feel he has passed the stone.  On arrival to the ED patient is afebrile, nontachycardic and nontachypneic.  Blood pressure initially elevated 152/113 however this is dissipated while he is in the ED. he received Zofran and fentanyl with EMS.  We will plan for repeat labs at this time and repeat urinalysis.  May consider repeating CT scan as well.  Have discussed case with attending physician Dr. Deretha Emory who agrees with plan.  CBC without leukocytosis. Hgb stable at 14.8 BMP with creatinine 1.54; baseline for patient. Glucose 125, bicarb WNL. NO gap.  U/A with positive nitrites, > 50 RBCs, 11-20 WBCs, and rare bacteria. Will plan to repeat CT scan at this time.   CT: IMPRESSION:  1. Migration of the distal LEFT ureteral calculus to the LEFT vesico  ureteral junction when compared to CT 12/09/2020.  2. New LEFT ureteral calculus in the mid LEFT ureter.  3. Moderate LEFT  hydronephrosis and hydroureter.  4. Several nonobstructing RIGHT renal calculus and a single  nonobstructing LEFT renal calculus.     AT shift change case signed out to Alveria Apley, PA-C, who will consult urology for further recommendations.   Final Clinical Impression(s) / ED Diagnoses Final diagnoses:  None    Rx / DC Orders ED Discharge Orders    None       Tanda Rockers, PA-C 12/12/20 1522    Vanetta Mulders, MD 12/13/20 [phone]

## 2020-12-12 NOTE — Discharge Instructions (Signed)
Take ibuprofen 3 times a day with meals. Take 600-800 mg (3-4 over the counter pills at a time. Do not take other anti-inflammatories at the same time (Advil, Motrin, naproxen, Aleve). You may supplement with Tylenol if you need further pain control. Use Percocet as needed for severe breakthrough pain. Continue using Zofran as needed for nausea or vomiting. You taking your pain medicine with food. Follow-up with the urologist tomorrow morning. Return to the emergency room if you develop high fevers, persistent vomiting, severe worsening pain, inability urinate, or any new, worsening, or concerning symptoms.

## 2020-12-12 NOTE — ED Triage Notes (Signed)
Pt c/o lower abd pain/groin pain- dx w 29mm kidney stone on 6/3 ([place] ED). Pt hasn't passed the stone yet. Hx of kidney stones w surgeries. Oxycodone not helping w pain. C/o nausea- given 4mg  zofran en route and fentanyl

## 2020-12-12 NOTE — ED Provider Notes (Signed)
  Physical Exam  BP 112/82   Pulse 80   Temp 98.8 F (37.1 C) (Oral)   Resp 20   SpO2 100%   Physical Exam Vitals and nursing note reviewed.  Constitutional:      Appearance: He is well-developed.     Comments: nontoxic  Cardiovascular:     Rate and Rhythm: Normal rate.  Pulmonary:     Effort: Pulmonary effort is normal.  Neurological:     Mental Status: He is alert.     ED Course/Procedures     Procedures  MDM   Pt signed out to me by Lajuana Carry, PA-C. Please see previous notes for further history.  In brief, patient presented for evaluation of worsening abdominal pain.  He was seen at [facility] a few days ago, diagnosed with a left-sided kidney stone.  He was given symptomatic medications, and Keflex for a possible early UTI.  He was not having any urinary symptoms.  Today he presents with worsening pain.  No fever, nausea, vomiting, or urinary symptoms.  He has needed surgery on kidney stones in the past.  Work-up overall reassuring.  CT shows movement of the original stone distally, however patient also with a second stone in the left side ureter.  Urine once again with some indications of infection, however some indications of improvement.  No leukocytosis.  No systemic symptoms.  Will consult urology.  Discussed with Dr. Liliane Shi from urology who reviewed UA results and CT.  Recommended follow-up in the office tomorrow.  Discussed findings and plan with patient, who is agreeable.  He reports pain is better controlled at this time.  We will give a dose of Toradol and make sure patient is able to eat and drink prior to discharge.  At this time, patient present for discharge.  Return precautions given.  Patient and fiance state they understand and agree to plan     Alveria Apley, PA-C 12/12/20 1716    Linwood Dibbles, MD 12/13/20 1050

## 2020-12-13 ENCOUNTER — Ambulatory Visit (HOSPITAL_BASED_OUTPATIENT_CLINIC_OR_DEPARTMENT_OTHER): Payer: 59 | Admitting: Certified Registered"

## 2020-12-13 ENCOUNTER — Ambulatory Visit (HOSPITAL_BASED_OUTPATIENT_CLINIC_OR_DEPARTMENT_OTHER)
Admission: RE | Admit: 2020-12-13 | Discharge: 2020-12-13 | Disposition: A | Discharge status: Home / Self Care | Payer: 59 | Source: Ambulatory Visit | Attending: Urology | Admitting: Urology

## 2020-12-13 ENCOUNTER — Encounter (HOSPITAL_BASED_OUTPATIENT_CLINIC_OR_DEPARTMENT_OTHER): Payer: Self-pay | Admitting: Urology

## 2020-12-13 ENCOUNTER — Other Ambulatory Visit: Payer: Self-pay | Admitting: Urology

## 2020-12-13 ENCOUNTER — Encounter (HOSPITAL_BASED_OUTPATIENT_CLINIC_OR_DEPARTMENT_OTHER)
Admission: RE | Disposition: A | Discharge status: Home / Self Care | Payer: Self-pay | Source: Ambulatory Visit | Attending: Urology

## 2020-12-13 DIAGNOSIS — N132 Hydronephrosis with renal and ureteral calculous obstruction: Secondary | ICD-10-CM | POA: Diagnosis present

## 2020-12-13 DIAGNOSIS — Z96642 Presence of left artificial hip joint: Secondary | ICD-10-CM | POA: Diagnosis not present

## 2020-12-13 HISTORY — PX: HOLMIUM LASER APPLICATION: SHX5852

## 2020-12-13 HISTORY — PX: CYSTOSCOPY WITH RETROGRADE PYELOGRAM, URETEROSCOPY AND STENT PLACEMENT: SHX5789

## 2020-12-13 SURGERY — CYSTOURETEROSCOPY, WITH RETROGRADE PYELOGRAM AND STENT INSERTION
Anesthesia: General | Site: Pelvis | Laterality: Left

## 2020-12-13 MED ORDER — ACETAMINOPHEN 10 MG/ML IV SOLN
INTRAVENOUS | Status: AC
  Filled 2020-12-13: qty 100

## 2020-12-13 MED ORDER — OXYCODONE HCL 5 MG PO TABS: 5.0000 mg | ORAL_TABLET | Freq: Once | ORAL | Status: DC | PRN

## 2020-12-13 MED ORDER — MIDAZOLAM HCL 2 MG/2ML IJ SOLN
INTRAMUSCULAR | Status: DC | PRN
  Administered 2020-12-13: 2 mg via INTRAVENOUS

## 2020-12-13 MED ORDER — OXYCODONE HCL 5 MG/5ML PO SOLN: 5.0000 mg | Freq: Once | ORAL | Status: DC | PRN

## 2020-12-13 MED ORDER — LIDOCAINE HCL (PF) 2 % IJ SOLN
INTRAMUSCULAR | Status: AC
  Filled 2020-12-13: qty 5

## 2020-12-13 MED ORDER — DIPHENHYDRAMINE HCL 50 MG/ML IJ SOLN
INTRAMUSCULAR | Status: DC | PRN
  Administered 2020-12-13: 12.5 mg via INTRAVENOUS

## 2020-12-13 MED ORDER — GENTAMICIN SULFATE 40 MG/ML IJ SOLN
5.0000 mg/kg | INTRAVENOUS | Status: AC
  Administered 2020-12-13: 470 mg via INTRAVENOUS
  Filled 2020-12-13: qty 11.75

## 2020-12-13 MED ORDER — FENTANYL CITRATE (PF) 100 MCG/2ML IJ SOLN: 25.0000 ug | INTRAMUSCULAR | Status: DC | PRN

## 2020-12-13 MED ORDER — PROPOFOL 10 MG/ML IV BOLUS
INTRAVENOUS | Status: AC
  Filled 2020-12-13: qty 40

## 2020-12-13 MED ORDER — IOHEXOL 300 MG/ML  SOLN
INTRAMUSCULAR | Status: DC | PRN
  Administered 2020-12-13: 10 mL via URETHRAL

## 2020-12-13 MED ORDER — DEXAMETHASONE SODIUM PHOSPHATE 10 MG/ML IJ SOLN
INTRAMUSCULAR | Status: DC | PRN
  Administered 2020-12-13: 10 mg via INTRAVENOUS

## 2020-12-13 MED ORDER — MIDAZOLAM HCL 2 MG/2ML IJ SOLN
INTRAMUSCULAR | Status: AC
  Filled 2020-12-13: qty 2

## 2020-12-13 MED ORDER — ONDANSETRON HCL 4 MG/2ML IJ SOLN
INTRAMUSCULAR | Status: AC
  Filled 2020-12-13: qty 2

## 2020-12-13 MED ORDER — LACTATED RINGERS IV SOLN: INTRAVENOUS | Status: DC

## 2020-12-13 MED ORDER — ACETAMINOPHEN 10 MG/ML IV SOLN
INTRAVENOUS | Status: DC | PRN
  Administered 2020-12-13: 1000 mg via INTRAVENOUS

## 2020-12-13 MED ORDER — LIDOCAINE 2% (20 MG/ML) 5 ML SYRINGE
INTRAMUSCULAR | Status: DC | PRN
  Administered 2020-12-13: 100 mg via INTRAVENOUS

## 2020-12-13 MED ORDER — FENTANYL CITRATE (PF) 100 MCG/2ML IJ SOLN
INTRAMUSCULAR | Status: DC | PRN
  Administered 2020-12-13 (×2): 50 ug via INTRAVENOUS

## 2020-12-13 MED ORDER — SODIUM CHLORIDE 0.9 % IR SOLN
Status: DC | PRN
  Administered 2020-12-13: 1500 mL

## 2020-12-13 MED ORDER — ONDANSETRON HCL 4 MG/2ML IJ SOLN
INTRAMUSCULAR | Status: DC | PRN
  Administered 2020-12-13: 4 mg via INTRAVENOUS

## 2020-12-13 MED ORDER — KETOROLAC TROMETHAMINE 30 MG/ML IJ SOLN
INTRAMUSCULAR | Status: DC | PRN
  Administered 2020-12-13: 30 mg via INTRAVENOUS

## 2020-12-13 MED ORDER — PROMETHAZINE HCL 25 MG/ML IJ SOLN: 6.2500 mg | INTRAMUSCULAR | Status: DC | PRN

## 2020-12-13 MED ORDER — FENTANYL CITRATE (PF) 100 MCG/2ML IJ SOLN
INTRAMUSCULAR | Status: AC
  Filled 2020-12-13: qty 2

## 2020-12-13 MED ORDER — PROPOFOL 10 MG/ML IV BOLUS
INTRAVENOUS | Status: DC | PRN
  Administered 2020-12-13: 120 mg via INTRAVENOUS
  Administered 2020-12-13: 280 mg via INTRAVENOUS

## 2020-12-13 SURGICAL SUPPLY — 29 items
BAG DRAIN URO-CYSTO SKYTR STRL (DRAIN) ×3 IMPLANT
BAG DRN UROCATH (DRAIN) ×1
BASKET LASER NITINOL 1.9FR (BASKET) ×3 IMPLANT
BSKT STON RTRVL 120 1.9FR (BASKET) ×1
CATH INTERMIT  6FR 70CM (CATHETERS) ×3 IMPLANT
CLOTH BEACON ORANGE TIMEOUT ST (SAFETY) ×3 IMPLANT
COVER DOME SNAP 22 D (MISCELLANEOUS) ×3 IMPLANT
DRSG TEGADERM 2-3/8X2-3/4 SM (GAUZE/BANDAGES/DRESSINGS) ×3 IMPLANT
FIBER LASER FLEXIVA 365 (UROLOGICAL SUPPLIES) IMPLANT
GLOVE SURG ENC MOIS LTX SZ7.5 (GLOVE) ×3 IMPLANT
GOWN STRL REUS W/TWL LRG LVL3 (GOWN DISPOSABLE) ×3 IMPLANT
GUIDEWIRE ANG ZIPWIRE 038X150 (WIRE) ×3 IMPLANT
GUIDEWIRE STR DUAL SENSOR (WIRE) ×3 IMPLANT
IV NS 1000ML (IV SOLUTION)
IV NS 1000ML BAXH (IV SOLUTION) IMPLANT
IV NS IRRIG 3000ML ARTHROMATIC (IV SOLUTION) ×3 IMPLANT
KIT TURNOVER CYSTO (KITS) ×3 IMPLANT
MANIFOLD NEPTUNE II (INSTRUMENTS) ×3 IMPLANT
NS IRRIG 500ML POUR BTL (IV SOLUTION) ×3 IMPLANT
PACK CYSTO (CUSTOM PROCEDURE TRAY) ×3 IMPLANT
SHEATH URETERAL 12FRX35CM (MISCELLANEOUS) ×3 IMPLANT
STENT POLARIS 5FRX26 (STENTS) ×3 IMPLANT
SYR 10ML LL (SYRINGE) ×3 IMPLANT
TRACTIP FLEXIVA PULS ID 200XHI (Laser) ×1 IMPLANT
TRACTIP FLEXIVA PULSE ID 200 (Laser) ×3
TUBE CONNECTING 12'X1/4 (SUCTIONS) ×1
TUBE CONNECTING 12X1/4 (SUCTIONS) ×2 IMPLANT
TUBE FEEDING 8FR 16IN STR KANG (MISCELLANEOUS) ×3 IMPLANT
TUBING UROLOGY SET (TUBING) ×3 IMPLANT

## 2020-12-13 NOTE — Anesthesia Preprocedure Evaluation (Signed)
Anesthesia Evaluation  Patient identified by MRN, date of birth, ID band Patient awake    Reviewed: Allergy & Precautions, NPO status , Patient's Chart, lab work & pertinent test results  Airway Mallampati: II  TM Distance: >3 FB Neck ROM: Full    Dental no notable dental hx.    Pulmonary asthma ,    Pulmonary exam normal breath sounds clear to auscultation       Cardiovascular Exercise Tolerance: Good negative cardio ROS Normal cardiovascular exam Rhythm:Regular Rate:Normal     Neuro/Psych Anxiety negative neurological ROS     GI/Hepatic negative GI ROS, Neg liver ROS,   Endo/Other  negative endocrine ROS  Renal/GU Renal disease (kidney stones)  negative genitourinary   Musculoskeletal  (+) Arthritis ,   Abdominal   Peds negative pediatric ROS (+)  Hematology negative hematology ROS (+)   Anesthesia Other Findings   Reproductive/Obstetrics negative OB ROS                             Anesthesia Physical Anesthesia Plan  ASA: II  Anesthesia Plan: General   Post-op Pain Management:    Induction: Intravenous  PONV Risk Score and Plan: 2 and Dexamethasone, Treatment may vary due to age or medical condition and Midazolam  Airway Management Planned: LMA  Additional Equipment: None  Intra-op Plan:   Post-operative Plan: Extubation in OR  Informed Consent: I have reviewed the patients History and Physical, chart, labs and discussed the procedure including the risks, benefits and alternatives for the proposed anesthesia with the patient or authorized representative who has indicated his/her understanding and acceptance.     Dental advisory given  Plan Discussed with: CRNA, Anesthesiologist and Surgeon  Anesthesia Plan Comments:         Anesthesia Quick Evaluation

## 2020-12-13 NOTE — Discharge Instructions (Signed)
1 - You may have urinary urgency (bladder spasms) and bloody urine on / off with stent in place. This is normal.  2 - Remove tehtered stent on Thursday morning at home by pulling on string, then blue-white plastic tubing, and discarding. Office is open Thursday if any problems arise.   3 - Call MD or go to ER for fever >102, severe pain / nausea / vomiting not relieved by medications, or acute change in medical status  [facility] [phone] Post Ureteroscopy With or Without Stent Instructions  Definitions:  Ureter: The duct that transports urine from the kidney to the bladder. Stent:   A plastic hollow tube that is placed into the ureter, from the kidney to the bladder to prevent the ureter from swelling shut.  GENERAL INSTRUCTIONS:  Despite the fact that no skin incisions were used, the area around the ureter and bladder is raw and irritated. The stent is a foreign body which will further irritate the bladder wall. This irritation is manifested by increased frequency of urination, both day and night, and by an increase in the urge to urinate. In some, the urge to urinate is present almost always. Sometimes the urge is strong enough that you may not be able to stop yourself from urinating. The only real cure is to remove the stent and then give time for the bladder wall to heal which can't be done until the danger of the ureter swelling shut has passed, which varies.  You may see some blood in your urine while the stent is in place and a few days afterwards. Do not be alarmed, even if the urine was clear for a while. Get off your feet and drink lots of fluids until clearing occurs. If you start to pass clots or don't improve, call us.  DIET: You may return to your normal diet immediately. Because of the raw surface of your bladder, alcohol, spicy foods, acid type foods and drinks with caffeine may cause irritation or frequency and should be used in moderation. To keep your  urine flowing freely and to avoid constipation, drink plenty of fluids during the day ( 8-10 glasses ). Tip: Avoid cranberry juice because it is very acidic.  ACTIVITY: Your physical activity doesn't need to be restricted. However, if you are very active, you may see some blood in your urine. We suggest that you reduce your activity under these circumstances until the bleeding has stopped.  BOWELS: It is important to keep your bowels regular during the postoperative period. Straining with bowel movements can cause bleeding. A bowel movement every other day is reasonable. Use a mild laxative if needed, such as Milk of Magnesia 2-3 tablespoons, or 2 Dulcolax tablets. Call if you continue to have problems. If you have been taking narcotics for pain, before, during or after your surgery, you may be constipated. Take a laxative if necessary.   MEDICATION: You should resume your pre-surgery medications unless told not to. In addition you will often be given an antibiotic to prevent infection. These should be taken as prescribed until the bottles are finished unless you are having an unusual reaction to one of the drugs.  PROBLEMS YOU SHOULD REPORT TO [place]:  Fevers over 100.5 Fahrenheit.  Heavy bleeding, or clots ( See above notes about blood in urine ).  Inability to urinate.  Drug reactions ( hives, rash, nausea, vomiting, diarrhea ).  Severe burning or pain with urination that is not improving.  FOLLOW-UP: You will need a  follow-up appointment to monitor your progress. Call for this appointment at the number listed above. Usually the first appointment will be about three to fourteen days after your surgery.  Post Anesthesia Home Care Instructions  Activity: Get plenty of rest for the remainder of the day. A responsible individual must stay with you for 24 hours following the procedure.  For the next 24 hours, DO NOT: -Drive a car -Advertising copywriter -Drink alcoholic beverages -Take any  medication unless instructed by your physician -Make any legal decisions or sign important papers.  Meals: Start with liquid foods such as gelatin or soup. Progress to regular foods as tolerated. Avoid greasy, spicy, heavy foods. If nausea and/or vomiting occur, drink only clear liquids until the nausea and/or vomiting subsides. Call your physician if vomiting continues.  Special Instructions/Symptoms: Your throat may feel dry or sore from the anesthesia or the breathing tube placed in your throat during surgery. If this causes discomfort, gargle with warm salt water. The discomfort should disappear within 24 hours.  No ibuprofen, Advil, Aleve, Motrin, or naproxen until after 9:15 pm today if needed. No Tylenol/acetaminophen until after 9:00 pm tonight if needed.

## 2020-12-13 NOTE — Anesthesia Postprocedure Evaluation (Signed)
Anesthesia Post Note  Patient: CARDEN TEEL  Procedure(s) Performed: CYSTOSCOPY WITH RETROGRADE PYELOGRAM, URETEROSCOPY AND STENT PLACEMENT (Left Pelvis) HOLMIUM LASER APPLICATION (Left Pelvis)     Patient location during evaluation: PACU Anesthesia Type: General Level of consciousness: awake and alert Pain management: pain level controlled Vital Signs Assessment: post-procedure vital signs reviewed and stable Respiratory status: spontaneous breathing, nonlabored ventilation, respiratory function stable and patient connected to nasal cannula oxygen Cardiovascular status: blood pressure returned to baseline and stable Postop Assessment: no apparent nausea or vomiting Anesthetic complications: no   No complications documented.  Last Vitals:  Vitals:   12/13/20 1534 12/13/20 1545  BP: 101/72 105/74  Pulse: 79 70  Resp: 18 15  Temp: 36.7 C   SpO2: 97% 94%    Last Pain:  Vitals:   12/13/20 1534  TempSrc:   PainSc: Asleep                 Lowery Paullin S

## 2020-12-13 NOTE — H&P (Signed)
Chris Williams is an 50 y.o. male.    Chief Complaint: Pre-Op LEFT Ureteroscopic Stone Manipulation  HPI:   1 - Recurrent Urolithiasis -  Pre 2022 - URS x 1  12/2020: Left distal 22mm + proximal 15mm ureteral and bilateral punctate renal stones by ER Ct x several 12/2020. UCX negative. Cr 1.54.   PMH sig for asthma, left total hip for trauma. NO ischemic CV disease / blood thinners. 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 His PCP is Mayra Neer NP.   Today " Erhardt " is seen to proceed with LEFT ureteroscopic stone manipulaiton for refracotry colic. NPO > 8hours.     Past Medical History:  Diagnosis Date  . Anxiety   . Arthritis    hip  . History of kidney stones   . Nausea without vomiting 05/12/2019    Past Surgical History:  Procedure Laterality Date  . CYSTOSCOPY/RETROGRADE/URETEROSCOPY/STONE EXTRACTION WITH BASKET Right 09/29/2012   Procedure: CYSTOSCOPY/RETROGRADE/URETEROSCOPY/STONE EXTRACTION WITH BASKET;  Surgeon: Anner Crete, MD;  Location: [facility];  Service: Urology;  Laterality: Right;  . HOLMIUM LASER APPLICATION Right 09/29/2012   Procedure: HOLMIUM LASER APPLICATION;  Surgeon: Anner Crete, MD;  Location: [facility];  Service: Urology;  Laterality: Right;  . TOTAL HIP ARTHROPLASTY Left 03/31/2019   Procedure: TOTAL HIP ARTHROPLASTY ANTERIOR APPROACH;  Surgeon: Durene Romans, MD;  Location: [facility];  Service: Orthopedics;  Laterality: Left;  70 mins    Family History  Problem Relation Age of Onset  . Hypertension Father   . CAD Other   . Deep vein thrombosis Other   . Coronary artery disease Other   . Hypertension Mother   . Cancer Maternal Grandmother   . Hyperlipidemia Maternal Grandmother   . Stroke Maternal Grandmother   . Cancer Maternal Grandfather   . Hyperlipidemia Maternal Grandfather   . Stroke Maternal Grandfather   . Cancer Paternal Grandmother   . Hyperlipidemia Paternal Grandmother   . Cancer Paternal Grandfather   . Hyperlipidemia Paternal  Grandfather    Social History:  reports that he has never smoked. He has never used smokeless tobacco. He reports that he does not drink alcohol and does not use drugs.  Allergies: No Known Allergies  No medications prior to admission.    Results for orders placed or performed during the hospital encounter of 12/12/20 (from the past 48 hour(s))  Basic metabolic panel     Status: Abnormal   Collection Time: 12/12/20 11:58 AM  Result Value Ref Range   Sodium 138 135 - 145 mmol/L   Potassium 3.5 3.5 - 5.1 mmol/L   Chloride 104 98 - 111 mmol/L   CO2 25 22 - 32 mmol/L   Glucose, Bld 125 (H) 70 - 99 mg/dL    Comment: Glucose reference range applies only to samples taken after fasting for at least 8 hours.   BUN 13 6 - 20 mg/dL   Creatinine, Ser 1.19 (H) 0.61 - 1.24 mg/dL   Calcium 9.1 8.9 - 14.7 mg/dL   GFR, Estimated 55 (L) >60 mL/min    Comment: (NOTE) Calculated using the CKD-EPI Creatinine Equation (2021)    Anion gap 9 5 - 15    Comment: Performed at [facility], [address] [place], [place] [address]  CBC with Differential     Status: None   Collection Time: 12/12/20 11:58 AM  Result Value Ref Range   WBC 9.6 4.0 - 10.5 K/uL   RBC 4.91 4.22 -  5.81 MIL/uL   Hemoglobin 14.8 13.0 - 17.0 g/dL   HCT 54.0 98.1 - 19.1 %   MCV 90.2 80.0 - 100.0 fL   MCH 30.1 26.0 - 34.0 pg   MCHC 33.4 30.0 - 36.0 g/dL   RDW 47.8 29.5 - 62.1 %   Platelets 189 150 - 400 K/uL   nRBC 0.0 0.0 - 0.2 %   Neutrophils Relative % 77 %   Neutro Abs 7.4 1.7 - 7.7 K/uL   Lymphocytes Relative 15 %   Lymphs Abs 1.5 0.7 - 4.0 K/uL   Monocytes Relative 8 %   Monocytes Absolute 0.7 0.1 - 1.0 K/uL   Eosinophils Relative 0 %   Eosinophils Absolute 0.0 0.0 - 0.5 K/uL   Basophils Relative 0 %   Basophils Absolute 0.0 0.0 - 0.1 K/uL   Immature Granulocytes 0 %   Abs Immature Granulocytes 0.03 0.00 - 0.07 K/uL    Comment: Performed at [facility], [address]  [place], [place] [address]  Urinalysis, Routine w reflex microscopic Urine, Clean Catch     Status: Abnormal   Collection Time: 12/12/20  2:11 PM  Result Value Ref Range   Color, Urine AMBER (A) YELLOW    Comment: BIOCHEMICALS MAY BE AFFECTED BY COLOR   APPearance CLOUDY (A) CLEAR   Specific Gravity, Urine 1.012 1.005 - 1.030   pH 5.0 5.0 - 8.0   Glucose, UA NEGATIVE NEGATIVE mg/dL   Hgb urine dipstick LARGE (A) NEGATIVE   Bilirubin Urine NEGATIVE NEGATIVE   Ketones, ur NEGATIVE NEGATIVE mg/dL   Protein, ur 784 (A) NEGATIVE mg/dL   Nitrite POSITIVE (A) NEGATIVE   Leukocytes,Ua NEGATIVE NEGATIVE   RBC / HPF >50 (H) 0 - 5 RBC/hpf   WBC, UA 11-20 0 - 5 WBC/hpf   Bacteria, UA RARE (A) NONE SEEN   Mucus PRESENT     Comment: Performed at [facility], [address] [place], [place] [address]   CT Renal Stone Study  Result Date: 12/12/2020 CLINICAL DATA:  Bilateral flank pain. EXAM: CT ABDOMEN AND PELVIS WITHOUT CONTRAST TECHNIQUE: Multidetector CT imaging of the abdomen and pelvis was performed following the standard protocol without IV contrast. COMPARISON:  CT 12/09/2020 FINDINGS: Lower chest: Lung bases are clear. Hepatobiliary: No focal hepatic lesion. No biliary duct dilatation. Common bile duct is normal. Pancreas: Pancreas is normal. No ductal dilatation. No pancreatic inflammation. Spleen: Normal spleen Adrenals/urinary tract: Adrenal glands normal. Mild hydro ureter hydronephrosis of the LEFT renal collecting system. New proximal LEFT renal calculus measures 6 mm on image 50/2. This calculus is at the L2 vertebral body level. The previous seen distal LEFT renal calculus has migrated to the LEFT vesicoureteral junction measuring 6 mm on image 85/2. Single nonobstructing LEFT renal calculus remains. Three nonobstructing RIGHT renal calculi again noted. No bladder calculi Stomach/Bowel: Stomach, small bowel, appendix, and cecum are normal. The colon and rectosigmoid colon are  normal. Vascular/Lymphatic: Abdominal aorta is normal caliber. No periportal or retroperitoneal adenopathy. No pelvic adenopathy. Reproductive: Prostate unremarkable Other: No free fluid. Musculoskeletal: No aggressive osseous lesion. IMPRESSION: 1. Migration of the distal LEFT ureteral calculus to the LEFT vesico ureteral junction when compared to CT 12/09/2020. 2. New LEFT ureteral calculus in the mid LEFT ureter. 3. Moderate LEFT hydronephrosis and hydroureter. 4. Several nonobstructing RIGHT renal calculus and a single nonobstructing LEFT renal calculus. Electronically Signed   By: Genevive Bi M.D.   On: 12/12/2020 15:11    Review  of Systems  Constitutional: Negative for chills and fever.  Gastrointestinal: Positive for nausea.  Genitourinary: Positive for flank pain and urgency.  All other systems reviewed and are negative.   There were no vitals taken for this visit. Physical Exam Vitals reviewed.  HENT:     Head: Normocephalic.     Nose: Nose normal.  Pulmonary:     Effort: Pulmonary effort is normal.  Abdominal:     Comments: Mild obesity. Moderate left CVAT  Musculoskeletal:        General: Normal range of motion.  Skin:    General: Skin is warm.  Neurological:     General: No focal deficit present.      Assessment/Plan  Proceed as planned with LEFT ureteroscopic stone manipulation. Risks, benefits, alternatives, expected peri-op course discussed previously and reiterated today.   Sebastian Ache, MD 12/13/2020, 11:21 AM

## 2020-12-13 NOTE — Transfer of Care (Signed)
Immediate Anesthesia Transfer of Care Note  Patient: Chris Williams  Procedure(s) Performed: Procedure(s) (LRB): CYSTOSCOPY WITH RETROGRADE PYELOGRAM, URETEROSCOPY AND STENT PLACEMENT (Left) HOLMIUM LASER APPLICATION (Left)  Patient Location: PACU  Anesthesia Type: General  Level of Consciousness:asleep  Airway & Oxygen Therapy: Patient Spontanous Breathing and Patient connected to face mask oxygen, oral airway remaining  Post-op Assessment: Report given to PACU RN and Post -op Vital signs reviewed and stable  Post vital signs: Reviewed and stable  Complications: No apparent anesthesia complications  Last Vitals:  Vitals Value Taken Time  BP 101/72 12/13/20 1534  Temp 36.7 C 12/13/20 1534  Pulse 73 12/13/20 1537  Resp 18 12/13/20 1537  SpO2 96 % 12/13/20 1537  Vitals shown include unvalidated device data.  Last Pain:  Vitals:   12/13/20 1157  TempSrc: Oral  PainSc: 0-No pain      Patients Stated Pain Goal: 4 (12/13/20 1157)  Complications: No complications documented.

## 2020-12-13 NOTE — Brief Op Note (Signed)
12/13/2020  3:20 PM  PATIENT:  Chris Williams  50 y.o. male  PRE-OPERATIVE DIAGNOSIS:  LEFT URETERAL STONE  POST-OPERATIVE DIAGNOSIS:  LEFT URETERAL STONE  PROCEDURE:  Procedure(s) with comments: CYSTOSCOPY WITH RETROGRADE PYELOGRAM, URETEROSCOPY AND STENT PLACEMENT (Left) - 1 HR HOLMIUM LASER APPLICATION (Left)  SURGEON:  Surgeon(s) and Role:    Sebastian Ache, MD - Primary  PHYSICIAN ASSISTANT:   ASSISTANTS: none   ANESTHESIA:   general  EBL:  miniaml   BLOOD ADMINISTERED:none  DRAINS: none   LOCAL MEDICATIONS USED:  NONE  SPECIMEN:  Source of Specimen:  Left renal  / ureteral stone fragments  DISPOSITION OF SPECIMEN:  [facility] for compositional analysis  COUNTS:  YES  TOURNIQUET:  * No tourniquets in log *  DICTATION: .Other Dictation: Dictation Number [id number]  PLAN OF CARE: Discharge to home after PACU  PATIENT DISPOSITION:  PACU - hemodynamically stable.   Delay start of Pharmacological VTE agent (>24hrs) due to surgical blood loss or risk of bleeding: not applicable

## 2020-12-13 NOTE — Anesthesia Procedure Notes (Signed)
Procedure Name: LMA Insertion Date/Time: 12/13/2020 2:48 PM Performed by: Norva Pavlov, CRNA Pre-anesthesia Checklist: Patient identified, Emergency Drugs available, Suction available and Patient being monitored Patient Re-evaluated:Patient Re-evaluated prior to induction Oxygen Delivery Method: Circle system utilized Preoxygenation: Pre-oxygenation with 100% oxygen Induction Type: IV induction Ventilation: Mask ventilation without difficulty LMA: LMA inserted LMA Size: 5.0 Number of attempts: 1 Airway Equipment and Method: Bite block Placement Confirmation: positive ETCO2 Tube secured with: Tape Dental Injury: Teeth and Oropharynx as per pre-operative assessment

## 2020-12-14 ENCOUNTER — Encounter (HOSPITAL_BASED_OUTPATIENT_CLINIC_OR_DEPARTMENT_OTHER): Payer: Self-pay | Admitting: Urology

## 2020-12-14 NOTE — Op Note (Signed)
Chris Williams, Chris Williams MEDICAL RECORD NO: [id number] ACCOUNT NO: [id number] DATE OF BIRTH: 07-25-1970 FACILITY: [facility] LOCATION: [facility] PHYSICIAN: Sebastian Ache, MD  Operative Report   DATE OF PROCEDURE: 12/13/2020  PREOPERATIVE DIAGNOSIS:  Left ureteral stone, multifocal, with refractory colic.  PROCEDURE PERFORMED: 1.  Cystoscopy with retrograde pyelogram, interpretation. 2.  Left ureteroscopy with laser lithotripsy. 3.  Insertion of left ureteral stent, 5 x 26 Polaris with tether.  ESTIMATED BLOOD LOSS:  Nil.  COMPLICATIONS:  None.  SPECIMEN:  Left ureteral stone fragments for composition analysis.  FINDINGS:   1.  Interval passage of left distal ureteral stone. 2.  Left proximal ureteral stone with mild obstruction. 3.  Complete resolution of all accessible stone fragments larger than one-third mm on the left side following laser lithotripsy and basket extraction. 4.  Successful placement of left ureteral stent, proximal end in the renal pelvis, distal end in the urinary bladder.  INDICATION:  The patient is a pleasant 50 year old man with a remote history of urolithiasis.  He was found on workup of colicky flank pain to have a recurrence including bilateral small volume renal stones, but two left ureteral stones, one distal and  proximal.  He has been seen in the ER twice in the last week.  His symptoms of colic have become refractory.  He was evaluated in the office earlier today.  Options were discussed including recommended passive left ureteroscopy as the stones are  multifocal.  He wished to proceed.  Informed consent was signed and placed in the medical record.  DESCRIPTION OF PROCEDURE:  The patient being verified, procedure being left ureteroscopic stone manipulation was confirmed.  Procedure timeout was performed.  Intravenous antibiotics administered.  General LMA anesthesia induced.  The patient was placed  into the low lithotomy position.  A sterile field was  created, prepped and draped the patient's penis, perineum and proximal thigh using iodine.  Cystourethroscopy was performed using 21 French rigid cystoscope with offset lens.  Inspection of anterior  and posterior urethra was unremarkable.  Inspection of bladder revealed no diverticula.  There was a dominant calcification in the dependent portion of the bladder.  This was irrigated and seemed to be consistent with his prior left distal ureteral  stone.  This was set aside for composition analysis as the patient is known to have multifocal ureteral stones.  The left ureteral orifice was cannulated with a 6-French renal catheter and left retrograde pyelogram was obtained.  Left retrograde pyelogram demonstrated single left ureter, single system left kidney.  There was mild hydroureteronephrosis to the distal ureter noted.  A 0.038 ZIPwire was advanced to the level of lower pole and set aside as a safety wire.  An 8 [place]  feeding tube placed in the urinary bladder for pressure release and semi-rigid ureteroscopy was performed in distal half of the left ureter alongside a separate sensor working wire.  There was some mild mucosal edema in the distal ureter consistent with  likely prior site of stone impaction, that was unremarkable.  The semirigid scope was exchanged for a 12/14 medium length ureteral access sheath at the level of the proximal ureter, using continuous fluoroscopic guidance, flexible digital ureteroscopy  was performed in the proximal left ureter and systematic inspection of the left kidney.  The previous left proximal ureteral stone had been retrograde positioned into an upper mid calix.  This was too large for simple basketing.  As such, holmium laser  energy was applied to the stone using setting of  0.2 joules and 20 Hz. This was fragmented into approximately four smaller pieces that was then amenable to simple basketing.  The stone was quite soft.  These were removed and set aside for  composition  analysis.  Following this complete resolution of all accessible stone fragments larger than one-third mm on the left side, access sheath was removed under continuous vision.  Again, there was some mild mucosal edema in the distal ureter consistent with  prior site of stone impaction.  Given this, it was felt that a brief interval stenting with a tethered stent would be most prudent.  As such, a new 5 x 26 Polaris type stent was placed over the remaining safety wire using fluoroscopic guidance.  Good  proximal and distal plane were noted.  Tether was left in place, fashioned to the dorsum of the penis.  The procedure was terminated.  The patient tolerated the procedure well.  No immediate perioperative complications.  The patient was taken to  postanesthesia care in stable condition.  Plan for discharge home.   SHW D: 12/13/2020 3:25:03 pm T: 12/13/2020 8:59:00 pm  JOB: [id number]/ [id number]

## 2023-02-07 ENCOUNTER — Emergency Department (HOSPITAL_BASED_OUTPATIENT_CLINIC_OR_DEPARTMENT_OTHER): Payer: Self-pay | Admitting: Radiology

## 2023-02-07 ENCOUNTER — Other Ambulatory Visit: Payer: Self-pay

## 2023-02-07 ENCOUNTER — Encounter (HOSPITAL_BASED_OUTPATIENT_CLINIC_OR_DEPARTMENT_OTHER): Payer: Self-pay | Admitting: Emergency Medicine

## 2023-02-07 DIAGNOSIS — Z79899 Other long term (current) drug therapy: Secondary | ICD-10-CM | POA: Insufficient documentation

## 2023-02-07 DIAGNOSIS — N189 Chronic kidney disease, unspecified: Secondary | ICD-10-CM | POA: Insufficient documentation

## 2023-02-07 DIAGNOSIS — Z7951 Long term (current) use of inhaled steroids: Secondary | ICD-10-CM | POA: Insufficient documentation

## 2023-02-07 DIAGNOSIS — N132 Hydronephrosis with renal and ureteral calculous obstruction: Secondary | ICD-10-CM | POA: Insufficient documentation

## 2023-02-07 DIAGNOSIS — J4540 Moderate persistent asthma, uncomplicated: Secondary | ICD-10-CM | POA: Insufficient documentation

## 2023-02-07 LAB — URINALYSIS, ROUTINE W REFLEX MICROSCOPIC
Bacteria, UA: NONE SEEN
Bilirubin Urine: NEGATIVE
Glucose, UA: NEGATIVE mg/dL
Ketones, ur: NEGATIVE mg/dL
Leukocytes,Ua: NEGATIVE
Nitrite: NEGATIVE
Protein, ur: 100 mg/dL — AB
RBC / HPF: >50 RBC/hpf (ref 0–5)
Specific Gravity, Urine: 1.023 (ref 1.005–1.030)
pH: 5.5 (ref 5.0–8.0)

## 2023-02-07 LAB — COMPREHENSIVE METABOLIC PANEL
ALT: 30 U/L (ref 0–44)
AST: 21 U/L (ref 15–41)
Albumin: 5 g/dL (ref 3.5–5.0)
Alkaline Phosphatase: 63 U/L (ref 38–126)
Anion gap: 10 (ref 5–15)
BUN: 19 mg/dL (ref 6–20)
CO2: 30 mmol/L (ref 22–32)
Calcium: 10.5 mg/dL — ABNORMAL HIGH (ref 8.9–10.3)
Chloride: 102 mmol/L (ref 98–111)
Creatinine, Ser: 1.8 mg/dL — ABNORMAL HIGH (ref 0.61–1.24)
GFR, Estimated: 45 mL/min — ABNORMAL LOW (ref >60)
Glucose, Bld: 108 mg/dL — ABNORMAL HIGH (ref 70–99)
Potassium: 3.6 mmol/L (ref 3.5–5.1)
Sodium: 142 mmol/L (ref 135–145)
Total Bilirubin: 0.8 mg/dL (ref 0.3–1.2)
Total Protein: 8.3 g/dL — ABNORMAL HIGH (ref 6.5–8.1)

## 2023-02-07 LAB — CBC
HCT: 50.1 % (ref 39.0–52.0)
Hemoglobin: 16.8 g/dL (ref 13.0–17.0)
MCH: 29.7 pg (ref 26.0–34.0)
MCHC: 33.5 g/dL (ref 30.0–36.0)
MCV: 88.7 fL (ref 80.0–100.0)
Platelets: 236 10*3/uL (ref 150–400)
RBC: 5.65 MIL/uL (ref 4.22–5.81)
RDW: 12.8 % (ref 11.5–15.5)
WBC: 8.3 10*3/uL (ref 4.0–10.5)
nRBC: 0 % (ref 0.0–0.2)

## 2023-02-07 LAB — LIPASE, BLOOD: Lipase: 28 U/L (ref 11–51)

## 2023-02-07 LAB — TROPONIN I (HIGH SENSITIVITY): Troponin I (High Sensitivity): 3 ng/L (ref <18)

## 2023-02-07 NOTE — ED Triage Notes (Signed)
Pt c/o right sided flank pain, lower abdominal pain, nausea, and centralized chest pain that started yesterday. Sts difficulty have BM, last BM yesterday. No SOB.

## 2023-02-08 ENCOUNTER — Emergency Department (HOSPITAL_BASED_OUTPATIENT_CLINIC_OR_DEPARTMENT_OTHER): Payer: Self-pay

## 2023-02-08 ENCOUNTER — Emergency Department (HOSPITAL_BASED_OUTPATIENT_CLINIC_OR_DEPARTMENT_OTHER)
Admission: EM | Admit: 2023-02-08 | Discharge: 2023-02-08 | Disposition: A | Discharge status: Home / Self Care | Payer: Self-pay | Attending: Emergency Medicine | Admitting: Emergency Medicine

## 2023-02-08 DIAGNOSIS — N201 Calculus of ureter: Secondary | ICD-10-CM

## 2023-02-08 MED ORDER — TAMSULOSIN HCL 0.4 MG PO CAPS
0.4000 mg | ORAL_CAPSULE | Freq: Once | ORAL | Status: AC
  Administered 2023-02-08: 0.4 mg via ORAL
  Filled 2023-02-08: qty 1

## 2023-02-08 MED ORDER — KETOROLAC TROMETHAMINE 15 MG/ML IJ SOLN
15.0000 mg | Freq: Once | INTRAMUSCULAR | Status: AC
  Administered 2023-02-08: 15 mg via INTRAVENOUS
  Filled 2023-02-08: qty 1

## 2023-02-08 MED ORDER — TAMSULOSIN HCL 0.4 MG PO CAPS: 0.4000 mg | ORAL_CAPSULE | Freq: Every day | ORAL | 0 refills | Status: AC

## 2023-02-08 MED ORDER — FENTANYL CITRATE PF 50 MCG/ML IJ SOSY
50.0000 ug | PREFILLED_SYRINGE | Freq: Once | INTRAMUSCULAR | Status: AC
  Administered 2023-02-08: 50 ug via INTRAVENOUS
  Filled 2023-02-08: qty 1

## 2023-02-08 MED ORDER — OXYCODONE HCL 5 MG PO TABS
2.5000 mg | ORAL_TABLET | Freq: Four times a day (QID) | ORAL | 0 refills | Status: AC | PRN
Start: 2023-02-08 — End: 2023-02-13

## 2023-02-08 MED ORDER — PROCHLORPERAZINE EDISYLATE 10 MG/2ML IJ SOLN
10.0000 mg | Freq: Once | INTRAMUSCULAR | Status: AC
  Administered 2023-02-08: 10 mg via INTRAVENOUS
  Filled 2023-02-08: qty 2

## 2023-02-08 NOTE — ED Notes (Signed)
Patient transported to CT 

## 2023-02-08 NOTE — ED Provider Notes (Signed)
[place] EMERGENCY DEPARTMENT AT [facility] Provider Note  CSN: [id number] Arrival date & time: 02/07/23 2057  Chief Complaint(s) Abdominal Pain  HPI Chris Williams is a 52 y.o. male patient presents with right flank pain and right lower abdominal pain with associated nausea.  Similar to prior renal stones.  Typically feels like kidney to have a bowel movement but states he has not been able to.  Denies any dysuria.  No emesis.  No recent fevers or infections.  Patient also endorsed 1 year of intermittent chest pain usually noted with movement of the right arm.  Feels like tightness in the sternal region.  Spontaneously resolves on its own.  Nonradiating and nonexertional.  No associated shortness of breath.  Currently asymptomatic.  HPI  Past Medical History Past Medical History:  Diagnosis Date   Anxiety    Arthritis    hip   History of kidney stones    Nausea without vomiting 05/12/2019   Patient Active Problem List   Diagnosis Date Noted   Nausea without vomiting 05/12/2019   Obese 04/01/2019   Asthma, extrinsic, moderate persistent, uncomplicated 04/07/2018   CKD (chronic kidney disease) stage 2, GFR 60-89 ml/min 02/18/2018   Nephrolithiasis 01/21/2018   Home Medication(s) Prior to Admission medications   Medication Sig Start Date End Date Taking? Authorizing Provider  oxyCODONE (ROXICODONE) 5 MG immediate release tablet Take 0.5-1 tablets (2.5-5 mg total) by mouth every 6 (six) hours as needed for up to 5 days for severe pain. 02/08/23 02/13/23 Yes , Amadeo Garnet, MD  tamsulosin (FLOMAX) 0.4 MG CAPS capsule Take 1 capsule (0.4 mg total) by mouth daily for 5 days. 02/08/23 02/13/23 Yes , Amadeo Garnet, MD  albuterol (PROVENTIL HFA;VENTOLIN HFA) 108 (90 Base) MCG/ACT inhaler Inhale 2 puffs into the lungs every 4 (four) hours as needed for wheezing or shortness of breath (cough, shortness of breath or wheezing.). 01/27/18   Benjiman Core D, PA-C  albuterol  (PROVENTIL) (2.5 MG/3ML) 0.083% nebulizer solution Take 3 mLs (2.5 mg total) by nebulization every 6 (six) hours as needed for wheezing or shortness of breath. 01/27/18   Benjiman Core D, PA-C  docusate sodium (COLACE) 100 MG capsule Take 1 capsule (100 mg total) by mouth 2 (two) times daily. Patient not taking: Reported on 05/12/2019 04/01/19   Lanney Gins, PA-C  ferrous sulfate (FERROUSUL) 325 (65 FE) MG tablet Take 1 tablet (325 mg total) by mouth 3 (three) times daily with meals for 14 days. 04/01/19 04/15/19  Lanney Gins, PA-C  fluticasone (FLONASE) 50 MCG/ACT nasal spray Place 1 spray into both nostrils daily for 5 days. 03/17/20 03/22/20  Sabas Sous, MD  Fluticasone Furoate (ARNUITY ELLIPTA) 200 MCG/ACT AEPB Inhale 1 puff into the lungs daily. Patient not taking: Reported on 05/12/2019 04/07/18   Glenford Bayley, NP  hydrOXYzine (ATARAX/VISTARIL) 25 MG tablet Take 0.5-1 tablets (12.5-25 mg total) by mouth every 8 (eight) hours as needed. Patient not taking: Reported on 05/12/2019 01/27/18   Benjiman Core D, PA-C  methocarbamol (ROBAXIN) 500 MG tablet Take 1 tablet (500 mg total) by mouth every 6 (six) hours as needed for muscle spasms. Patient not taking: Reported on 05/12/2019 04/01/19   Lanney Gins, PA-C  naproxen (NAPROSYN) 500 MG tablet Take 1 tablet (500 mg total) by mouth 2 (two) times daily with a meal. 08/22/20   Jene Every, MD  ondansetron (ZOFRAN ODT) 4 MG disintegrating tablet Take 1 tablet (4 mg total) by mouth every 8 (eight) hours as  needed. 08/22/20   Jene Every, MD  ondansetron (ZOFRAN) 4 MG tablet Take 1 tablet (4 mg total) by mouth every 8 (eight) hours as needed for up to 20 doses for nausea or vomiting. 12/09/20   Curatolo, Adam, DO  polyethylene glycol (MIRALAX / GLYCOLAX) 17 g packet Take 17 g by mouth 2 (two) times daily. Patient not taking: Reported on 05/12/2019 04/01/19   Lanney Gins, PA-C                                                                                                                                     Allergies Patient has no known allergies.  Review of Systems Review of Systems As noted in HPI  Physical Exam Vital Signs  I have reviewed the triage vital signs BP 109/80   Pulse 76   Temp 97.6 F (36.4 C)   Resp (!) 21   Ht 6\' 1"  (1.854 m)   Wt 113.4 kg   SpO2 94%   BMI 32.98 kg/m   Physical Exam Vitals reviewed.  Constitutional:      General: He is not in acute distress.    Appearance: He is well-developed. He is not diaphoretic.  HENT:     Head: Normocephalic and atraumatic.     Right Ear: External ear normal.     Left Ear: External ear normal.     Nose: Nose normal.     Mouth/Throat:     Mouth: Mucous membranes are moist.  Eyes:     General: No scleral icterus.    Conjunctiva/sclera: Conjunctivae normal.  Neck:     Trachea: Phonation normal.  Cardiovascular:     Rate and Rhythm: Regular rhythm.  Pulmonary:     Effort: Pulmonary effort is normal. No respiratory distress.     Breath sounds: No stridor.  Abdominal:     General: There is no distension.     Tenderness: There is abdominal tenderness in the right lower quadrant and suprapubic area.  Musculoskeletal:        General: Normal range of motion.     Cervical back: Normal range of motion.  Neurological:     Mental Status: He is alert and oriented to person, place, and time.  Psychiatric:        Behavior: Behavior normal.     ED Results and Treatments Labs (all labs ordered are listed, but only abnormal results are displayed) Labs Reviewed  COMPREHENSIVE METABOLIC PANEL - Abnormal; Notable for the following components:      Result Value   Glucose, Bld 108 (*)    Creatinine, Ser 1.80 (*)    Calcium 10.5 (*)    Total Protein 8.3 (*)    GFR, Estimated 45 (*)    All other components within normal limits  URINALYSIS, ROUTINE W REFLEX MICROSCOPIC - Abnormal; Notable for the following components:   Hgb urine dipstick LARGE (*)     Protein,  ur 100 (*)    All other components within normal limits  LIPASE, BLOOD  CBC  TROPONIN I (HIGH SENSITIVITY)  TROPONIN I (HIGH SENSITIVITY)                                                                                                                         EKG  EKG Interpretation Date/Time:  Thursday February 07 2023 21:11:27 EDT Ventricular Rate:  82 PR Interval:  142 QRS Duration:  86 QT Interval:  376 QTC Calculation: 439 R Axis:   8  Text Interpretation: Normal sinus rhythm Minimal voltage criteria for LVH, may be normal variant ( R in aVL ) Borderline ECG When compared with ECG of 09-Dec-2020 15:32, No significant change was found Confirmed by Drema Pry [phone]) on 02/08/2023 2:14:56 AM       Radiology CT Renal Stone Study  Result Date: 02/08/2023 CLINICAL DATA:  Flank pain.  Concern for kidney stone. EXAM: CT ABDOMEN AND PELVIS WITHOUT CONTRAST TECHNIQUE: Multidetector CT imaging of the abdomen and pelvis was performed following the standard protocol without IV contrast. RADIATION DOSE REDUCTION: This exam was performed according to the departmental dose-optimization program which includes automated exposure control, adjustment of the mA and/or kV according to patient size and/or use of iterative reconstruction technique. COMPARISON:  CT abdomen pelvis dated 12/12/2020. FINDINGS: Evaluation of this exam is limited in the absence of intravenous contrast. Lower chest: The visualized lung bases are clear. No intra-abdominal free air or free fluid. Hepatobiliary: Fatty liver. No biliary dilatation. The gallbladder is unremarkable. Pancreas: Unremarkable. No pancreatic ductal dilatation or surrounding inflammatory changes. Spleen: Normal in size without focal abnormality. Adrenals/Urinary Tract: The adrenal glands are unremarkable. There is a 5 mm stone in the distal right ureter with mild right hydronephrosis. Several additional smaller nonobstructing right renal calculi  measure up to 2-3 mm. There is a 2 mm nonobstructing left renal inferior pole calculus. No hydronephrosis on the left. The left ureter appears unremarkable. The urinary bladder is collapsed. Stomach/Bowel: There is no bowel obstruction or active inflammation. The appendix is normal. Vascular/Lymphatic: The abdominal aorta and IVC are unremarkable on this noncontrast CT. No portal venous gas. There is no adenopathy. Reproductive: The prostate and seminal vesicles are grossly unremarkable. No pelvic mass. Other: Small fat containing umbilical hernia. Musculoskeletal: Total left hip arthroplasty. No acute osseous pathology. IMPRESSION: 1. A 5 mm distal right ureteral stone with mild right hydronephrosis. 2. Additional smaller nonobstructing bilateral renal calculi. 3. Fatty liver. 4. No bowel obstruction. Normal appendix. Electronically Signed   By: Elgie Collard M.D.   On: 02/08/2023 03:22   DG Chest 2 View  Result Date: 02/07/2023 CLINICAL DATA:  Chest pain EXAM: CHEST - 2 VIEW COMPARISON:  X-ray 01/21/2018 FINDINGS: No consolidation, pneumothorax or effusion. No edema. Normal cardiopericardial silhouette. Degenerative changes are seen along the spine. IMPRESSION: No acute cardiopulmonary disease. Electronically Signed   By: Karen Kays M.D.   On: 02/07/2023 21:32  Medications Ordered in ED Medications  prochlorperazine (COMPAZINE) injection 10 mg (10 mg Intravenous Given 02/08/23 0254)  fentaNYL (SUBLIMAZE) injection 50 mcg (50 mcg Intravenous Given 02/08/23 0253)  tamsulosin (FLOMAX) capsule 0.4 mg (0.4 mg Oral Given 02/08/23 0407)  ketorolac (TORADOL) 15 MG/ML injection 15 mg (15 mg Intravenous Given 02/08/23 0405)   Procedures Procedures  (including critical care time) Medical Decision Making / ED Course   Medical Decision Making Amount and/or Complexity of Data Reviewed Labs: ordered. Radiology: ordered.  Risk Prescription drug management.     Clinical Course as of 02/08/23 0531  Fri  Feb 08, 2023  1610 Patient presents with right lower abdominal pain.  Feels similar to prior renal stones.  No dysuria.  Will assess for evidence of renal stone, UTI.  Will also assess for intra-abdominal inflammatory/infectious process such as appendicitis or diverticulitis.  CBC without leukocytosis or anemia Metabolic panel without significant electrolyte derangements.  Renal insufficiency without AKI. No biliary obstruction or pancreatitis. UA with hematuria, no infection. CT consistent with 5 mm right ureteral stone.  No other acute findings.  Patient also endorses 1 year of intermittent chest pain. Favors MSK based on history. In triage, cardiac workup was obtained showing reassuring EKG without acute ischemic changes or pericarditis. Serial troponins were negative x 2. Chest x-ray without evidence of pneumonia, pneumothorax, pulmonary edema or pleural effusions. Not classic for aortic dissection.  Low suspicion for pulmonary embolism.  Patient treated symptomatically for renal colic.  Pain controlled.  Already has urology established.  Recommended follow-up  [PC]    Clinical Course User Index [PC] , Amadeo Garnet, MD    Final Clinical Impression(s) / ED Diagnoses Final diagnoses:  Ureterolithiasis   The patient appears reasonably screened and/or stabilized for discharge and I doubt any other medical condition or other [facility] requiring further screening, evaluation, or treatment in the ED at this time. I have discussed the findings, Dx and Tx plan with the patient/family who expressed understanding and agree(s) with the plan. Discharge instructions discussed at length. The patient/family was given strict return precautions who verbalized understanding of the instructions. No further questions at time of discharge.  Disposition: Discharge  Condition: Good  ED Discharge Orders          Ordered    tamsulosin (FLOMAX) 0.4 MG CAPS capsule  Daily        02/08/23 0407     oxyCODONE (ROXICODONE) 5 MG immediate release tablet  Every 6 hours PRN        02/08/23 0407           Follow Up: Urology  Call  to schedule an appointment for close follow up    This chart was dictated using voice recognition software.  Despite best efforts to proofread,  errors can occur which can change the documentation meaning.    Nira Conn, MD 02/08/23 [phone]

## 2023-02-08 NOTE — ED Notes (Signed)
Discharge instructions provided by edp were discussed with pt. Pt verbalized understanding with no additional questions at this time. Pt to go home with s/o at bedside  

## 2023-02-14 ENCOUNTER — Other Ambulatory Visit: Payer: Self-pay | Admitting: Urology

## 2023-02-15 NOTE — Progress Notes (Signed)
Sent message, via epic in basket, requesting orders in epic from surgeon.  

## 2023-02-17 NOTE — Patient Instructions (Signed)
SURGICAL WAITING ROOM VISITATION Patients having surgery or a procedure may have no more than 2 support people in the waiting area - these visitors may rotate in the visitor waiting room.   Due to an increase in RSV and influenza rates and associated hospitalizations, children ages 26 and under may not visit patients in [facility] hospitals. If the patient needs to stay at the hospital during part of their recovery, the visitor guidelines for inpatient rooms apply.  PRE-OP VISITATION  Pre-op nurse will coordinate an appropriate time for 1 support person to accompany the patient in pre-op.  This support person may not rotate.  This visitor will be contacted when the time is appropriate for the visitor to come back in the pre-op area.  Please refer to the [facility] website for the visitor guidelines for Inpatients (after your surgery is over and you are in a regular room).  You are not required to quarantine at this time prior to your surgery. However, you must do this: Hand Hygiene often Do NOT share personal items Notify your provider if you are in close contact with someone who has COVID or you develop fever 100.4 or greater, new onset of sneezing, cough, sore throat, shortness of breath or body aches.  If you test positive for Covid or have been in contact with anyone that has tested positive in the last 10 days please notify you surgeon.    Your procedure is scheduled on:  Wednesday  02-27-23   Report to [facility] Main Entrance: [place] [facility] entrance where the [employer] is available.   Report to admitting at: 06:15    AM  Call this number if you have any questions or problems the morning of surgery [phone]  DO NOT EAT OR DRINK ANYTHING AFTER MIDNIGHT THE NIGHT PRIOR TO YOUR SURGERY / PROCEDURE.   FOLLOW BOWEL PREP AND ANY ADDITIONAL PRE OP INSTRUCTIONS YOU RECEIVED FROM YOUR SURGEON'S OFFICE!!!   Oral Hygiene is also important to reduce your risk of infection.         Remember - BRUSH YOUR TEETH THE MORNING OF SURGERY WITH YOUR REGULAR TOOTHPASTE  Do NOT smoke after Midnight the night before surgery.  STOP TAKING all Vitamins, Herbs and supplements 1 week before your surgery.   Take ONLY these medicines the morning of surgery with A SIP OF WATER: amlodipine                    You may not have any metal on your body including  jewelry, and body piercing  Do not wear  lotions, powders,  cologne, or deodorant   Men may shave face and neck.  Patients discharged on the day of surgery will not be allowed to drive home.  Someone NEEDS to stay with you for the first 24 hours after anesthesia.  Do not bring your home medications to the hospital. The Pharmacy will dispense medications listed on your medication list to you during your admission in the Hospital.  Please read over the following fact sheets you were given: IF YOU HAVE QUESTIONS ABOUT YOUR PRE-OP INSTRUCTIONS, PLEASE CALL [phone].   [facility] - Preparing for Surgery Before surgery, you can play an important role.  Because skin is not sterile, your skin needs to be as free of germs as possible.  You can reduce the number of germs on your skin by washing with CHG (chlorahexidine gluconate) soap before surgery.  CHG is an antiseptic cleaner which kills germs  and bonds with the skin to continue killing germs even after washing. Please DO NOT use if you have an allergy to CHG or antibacterial soaps.  If your skin becomes reddened/irritated stop using the CHG and inform your nurse when you arrive at Short Stay. Do not shave (including legs and underarms) for at least 48 hours prior to the first CHG shower.  You may shave your face/neck.  Please follow these instructions carefully:  1.  Shower with CHG Soap the night before surgery and the  morning of surgery.  2.  If you choose to wash your hair, wash your hair first as usual with your normal  shampoo.  3.  After you shampoo, rinse your hair  and body thoroughly to remove the shampoo.                             4.  Use CHG as you would any other liquid soap.  You can apply chg directly to the skin and wash.  Gently with a scrungie or clean washcloth.  5.  Apply the CHG Soap to your body ONLY FROM THE NECK DOWN.   Do not use on face/ open                           Wound or open sores. Avoid contact with eyes, ears mouth and genitals (private parts).                       Wash face,  Genitals (private parts) with your normal soap.             6.  Wash thoroughly, paying special attention to the area where your  surgery  will be performed.  7.  Thoroughly rinse your body with warm water from the neck down.  8.  DO NOT shower/wash with your normal soap after using and rinsing off the CHG Soap.            9.  Pat yourself dry with a clean towel.            10.  Wear clean pajamas.            11.  Place clean sheets on your bed the night of your first shower and do not  sleep with pets.  ON THE DAY OF SURGERY : Do not apply any lotions/deodorants the morning of surgery.  Please wear clean clothes to the hospital/surgery center.    FAILURE TO FOLLOW THESE INSTRUCTIONS MAY RESULT IN THE CANCELLATION OF YOUR SURGERY  PATIENT SIGNATURE_________________________________  NURSE SIGNATURE__________________________________  ________________________________________________________________________

## 2023-02-17 NOTE — Progress Notes (Signed)
COVID Vaccine received:  [x]  No []  Yes Date of any COVID positive Test in last 90 days:  None  PCP - Vida Rigger, NP Cardiologist - None  Chest x-ray - 02-07-2023 EKG -  02-11-2023  Epic Stress Test -  ECHO - 05-17-2016  Epic Cardiac Cath -   PCR screen: []  Ordered & Completed           []   No Order but Needs PROFEND           [x]   N/A for this surgery  Surgery Plan:  [x]  Ambulatory                            []  Outpatient in bed                            []  Admit  Anesthesia:    [x]  General  []  Spinal                           []   Choice []   MAC  Bowel Prep - [x]  No  []   Yes ______  Pacemaker / ICD device [x]  No []  Yes   Spinal Cord Stimulator:[x]  No []  Yes       History of Sleep Apnea? [x]  No []  Yes   CPAP used?- [x]  No []  Yes    Does the patient monitor blood sugar?          []  No []  Yes  [x]  N/A  Patient has: [x]  NO Hx DM   []  Pre-DM                 []  DM1  []   DM2  Blood Thinner / Instructions: none Aspirin Instructions:  none  ERAS Protocol Ordered: []  No  []  Yes            PRE-SURGERY []  ENSURE  []  G2  []  No Drink Ordered  Patient is to be NPO after:   Comments: patient had CBC, CMP done 02-07-2023 while at the ED,  only PCR at his PST appt.  Activity level: Patient is able to climb a flight of stairs without difficulty; [x]  No CP  [x]  No SOB.  Patient can not perform ADLs without assistance.   Anesthesia review:   HTN, CKD 2, asthma  Patient denies shortness of breath, fever, cough and chest pain at PAT appointment.  Patient verbalized understanding and agreement to the Pre-Surgical Instructions that were given to them at this PAT appointment. Patient was also educated of the need to review these PAT instructions again prior to his surgery.I reviewed the appropriate phone numbers to call if they have any and questions or concerns.

## 2023-02-19 ENCOUNTER — Other Ambulatory Visit: Payer: Self-pay

## 2023-02-19 ENCOUNTER — Encounter (HOSPITAL_COMMUNITY)
Admission: RE | Admit: 2023-02-19 | Discharge: 2023-02-19 | Disposition: A | Discharge status: Home / Self Care | Payer: Self-pay | Source: Ambulatory Visit | Attending: Urology | Admitting: Urology

## 2023-02-19 ENCOUNTER — Encounter (HOSPITAL_COMMUNITY): Payer: Self-pay

## 2023-02-19 DIAGNOSIS — Z01812 Encounter for preprocedural laboratory examination: Secondary | ICD-10-CM | POA: Insufficient documentation

## 2023-02-19 DIAGNOSIS — Z8614 Personal history of Methicillin resistant Staphylococcus aureus infection: Secondary | ICD-10-CM | POA: Insufficient documentation

## 2023-02-19 HISTORY — DX: Essential (primary) hypertension: I10

## 2023-02-19 LAB — SURGICAL PCR SCREEN
MRSA, PCR: NEGATIVE
Staphylococcus aureus: NEGATIVE

## 2023-02-27 ENCOUNTER — Ambulatory Visit (HOSPITAL_COMMUNITY): Payer: Self-pay | Admitting: Anesthesiology

## 2023-02-27 ENCOUNTER — Ambulatory Visit (HOSPITAL_COMMUNITY): Payer: Self-pay

## 2023-02-27 ENCOUNTER — Ambulatory Visit (HOSPITAL_COMMUNITY)
Admission: RE | Admit: 2023-02-27 | Discharge: 2023-02-27 | Disposition: A | Discharge status: Home / Self Care | Payer: Self-pay | Attending: Urology | Admitting: Urology

## 2023-02-27 ENCOUNTER — Other Ambulatory Visit: Payer: Self-pay

## 2023-02-27 ENCOUNTER — Ambulatory Visit (HOSPITAL_BASED_OUTPATIENT_CLINIC_OR_DEPARTMENT_OTHER): Payer: Self-pay | Admitting: Anesthesiology

## 2023-02-27 ENCOUNTER — Encounter (HOSPITAL_COMMUNITY)
Admission: RE | Disposition: A | Discharge status: Home / Self Care | Payer: Self-pay | Source: Home / Self Care | Attending: Urology

## 2023-02-27 ENCOUNTER — Encounter (HOSPITAL_COMMUNITY): Payer: Self-pay | Admitting: Urology

## 2023-02-27 DIAGNOSIS — Z8614 Personal history of Methicillin resistant Staphylococcus aureus infection: Secondary | ICD-10-CM

## 2023-02-27 DIAGNOSIS — N201 Calculus of ureter: Secondary | ICD-10-CM

## 2023-02-27 DIAGNOSIS — N202 Calculus of kidney with calculus of ureter: Secondary | ICD-10-CM | POA: Insufficient documentation

## 2023-02-27 HISTORY — PX: CYSTOSCOPY/URETEROSCOPY/HOLMIUM LASER/STENT PLACEMENT: SHX6546

## 2023-02-27 SURGERY — CYSTOSCOPY/URETEROSCOPY/HOLMIUM LASER/STENT PLACEMENT
Anesthesia: General | Laterality: Right

## 2023-02-27 MED ORDER — LIDOCAINE HCL (PF) 2 % IJ SOLN
INTRAMUSCULAR | Status: AC
  Filled 2023-02-27: qty 5

## 2023-02-27 MED ORDER — ORAL CARE MOUTH RINSE: 15.0000 mL | Freq: Once | OROMUCOSAL | Status: AC

## 2023-02-27 MED ORDER — KETOROLAC TROMETHAMINE 10 MG PO TABS: 10.0000 mg | ORAL_TABLET | Freq: Three times a day (TID) | ORAL | 0 refills | Status: AC | PRN

## 2023-02-27 MED ORDER — OXYCODONE HCL 5 MG/5ML PO SOLN: 5.0000 mg | Freq: Once | ORAL | Status: DC | PRN

## 2023-02-27 MED ORDER — SODIUM CHLORIDE 0.9 % IR SOLN
Status: DC | PRN
  Administered 2023-02-27: 3000 mL via INTRAVESICAL

## 2023-02-27 MED ORDER — LIDOCAINE 2% (20 MG/ML) 5 ML SYRINGE
INTRAMUSCULAR | Status: DC | PRN
  Administered 2023-02-27: 100 mg via INTRAVENOUS

## 2023-02-27 MED ORDER — PROPOFOL 10 MG/ML IV BOLUS
INTRAVENOUS | Status: DC | PRN
  Administered 2023-02-27: 200 mg via INTRAVENOUS

## 2023-02-27 MED ORDER — FENTANYL CITRATE (PF) 100 MCG/2ML IJ SOLN
INTRAMUSCULAR | Status: DC | PRN
  Administered 2023-02-27: 100 ug via INTRAVENOUS

## 2023-02-27 MED ORDER — PROPOFOL 10 MG/ML IV BOLUS
INTRAVENOUS | Status: AC
  Filled 2023-02-27: qty 20

## 2023-02-27 MED ORDER — ACETAMINOPHEN 10 MG/ML IV SOLN: 1000.0000 mg | Freq: Once | INTRAVENOUS | Status: DC | PRN

## 2023-02-27 MED ORDER — FENTANYL CITRATE (PF) 100 MCG/2ML IJ SOLN
INTRAMUSCULAR | Status: AC
  Filled 2023-02-27: qty 2

## 2023-02-27 MED ORDER — DROPERIDOL 2.5 MG/ML IJ SOLN
INTRAMUSCULAR | Status: AC
  Filled 2023-02-27: qty 2

## 2023-02-27 MED ORDER — FENTANYL CITRATE PF 50 MCG/ML IJ SOSY: 25.0000 ug | PREFILLED_SYRINGE | INTRAMUSCULAR | Status: DC | PRN

## 2023-02-27 MED ORDER — GENTAMICIN SULFATE 40 MG/ML IJ SOLN
5.0000 mg/kg | INTRAVENOUS | Status: AC
  Administered 2023-02-27: 570 mg via INTRAVENOUS
  Filled 2023-02-27: qty 14.25

## 2023-02-27 MED ORDER — IOHEXOL 300 MG/ML  SOLN
INTRAMUSCULAR | Status: DC | PRN
  Administered 2023-02-27: 15 mL

## 2023-02-27 MED ORDER — LACTATED RINGERS IV SOLN: INTRAVENOUS | Status: DC

## 2023-02-27 MED ORDER — MIDAZOLAM HCL 5 MG/5ML IJ SOLN
INTRAMUSCULAR | Status: DC | PRN
  Administered 2023-02-27: 2 mg via INTRAVENOUS

## 2023-02-27 MED ORDER — ONDANSETRON HCL 4 MG/2ML IJ SOLN
INTRAMUSCULAR | Status: DC | PRN
Start: 2023-02-27 — End: 2023-02-27
  Administered 2023-02-27: 4 mg via INTRAVENOUS

## 2023-02-27 MED ORDER — DROPERIDOL 2.5 MG/ML IJ SOLN
0.6250 mg | Freq: Once | INTRAMUSCULAR | Status: AC | PRN
  Administered 2023-02-27: 0.625 mg via INTRAVENOUS

## 2023-02-27 MED ORDER — DEXAMETHASONE SODIUM PHOSPHATE 10 MG/ML IJ SOLN
INTRAMUSCULAR | Status: DC | PRN
  Administered 2023-02-27: 10 mg via INTRAVENOUS

## 2023-02-27 MED ORDER — CHLORHEXIDINE GLUCONATE 0.12 % MT SOLN
15.0000 mL | Freq: Once | OROMUCOSAL | Status: AC
  Administered 2023-02-27: 15 mL via OROMUCOSAL

## 2023-02-27 MED ORDER — OXYCODONE HCL 5 MG PO TABS: 5.0000 mg | ORAL_TABLET | Freq: Four times a day (QID) | ORAL | 0 refills | Status: AC | PRN

## 2023-02-27 MED ORDER — MIDAZOLAM HCL 2 MG/2ML IJ SOLN
INTRAMUSCULAR | Status: AC
  Filled 2023-02-27: qty 2

## 2023-02-27 MED ORDER — ONDANSETRON HCL 4 MG/2ML IJ SOLN
INTRAMUSCULAR | Status: AC
  Filled 2023-02-27: qty 2

## 2023-02-27 MED ORDER — CEPHALEXIN 500 MG PO CAPS: 500.0000 mg | ORAL_CAPSULE | Freq: Two times a day (BID) | ORAL | 0 refills | Status: AC

## 2023-02-27 MED ORDER — OXYCODONE HCL 5 MG PO TABS: 5.0000 mg | ORAL_TABLET | Freq: Once | ORAL | Status: DC | PRN

## 2023-02-27 MED ORDER — DEXAMETHASONE SODIUM PHOSPHATE 10 MG/ML IJ SOLN
INTRAMUSCULAR | Status: AC
  Filled 2023-02-27: qty 1

## 2023-02-27 SURGICAL SUPPLY — 23 items
BAG URO CATCHER STRL LF (MISCELLANEOUS) ×1 IMPLANT
BASKET LASER NITINOL 1.9FR (BASKET) IMPLANT
BSKT STON RTRVL 120 1.9FR (BASKET)
CATH URETL OPEN END 6FR 70 (CATHETERS) ×1 IMPLANT
CLOTH BEACON ORANGE TIMEOUT ST (SAFETY) ×1 IMPLANT
EXTRACTOR STONE 1.7FRX115CM (UROLOGICAL SUPPLIES) IMPLANT
GLOVE SURG LX STRL 7.5 STRW (GLOVE) ×1 IMPLANT
GOWN STRL REUS W/ TWL XL LVL3 (GOWN DISPOSABLE) ×1 IMPLANT
GOWN STRL REUS W/TWL XL LVL3 (GOWN DISPOSABLE) ×1
GUIDEWIRE ANG ZIPWIRE 038X150 (WIRE) ×1 IMPLANT
GUIDEWIRE STR DUAL SENSOR (WIRE) ×1 IMPLANT
KIT TURNOVER KIT A (KITS) IMPLANT
LASER FIB FLEXIVA PULSE ID 365 (Laser) IMPLANT
MANIFOLD NEPTUNE II (INSTRUMENTS) ×1 IMPLANT
PACK CYSTO (CUSTOM PROCEDURE TRAY) ×1 IMPLANT
SHEATH NAVIGATOR HD 11/13X28 (SHEATH) IMPLANT
SHEATH NAVIGATOR HD 11/13X36 (SHEATH) IMPLANT
STENT POLARIS 5FRX26 (STENTS) IMPLANT
TRACTIP FLEXIVA PULS ID 200XHI (Laser) IMPLANT
TRACTIP FLEXIVA PULSE ID 200 (Laser) ×1
TUBE PU 8FR 16IN ENFIT (TUBING) ×1 IMPLANT
TUBING CONNECTING 10 (TUBING) ×1 IMPLANT
TUBING UROLOGY SET (TUBING) ×1 IMPLANT

## 2023-02-27 NOTE — Anesthesia Procedure Notes (Signed)
Procedure Name: LMA Insertion Date/Time: 02/27/2023 8:24 AM  Performed by: Doran Clay, CRNAPre-anesthesia Checklist: Patient identified, Emergency Drugs available, Suction available, Patient being monitored and Timeout performed Patient Re-evaluated:Patient Re-evaluated prior to induction Oxygen Delivery Method: Circle system utilized Preoxygenation: Pre-oxygenation with 100% oxygen Induction Type: IV induction LMA: LMA inserted LMA Size: 5.0 Tube type: Oral Number of attempts: 1 Placement Confirmation: positive ETCO2 and breath sounds checked- equal and bilateral Tube secured with: Tape Dental Injury: Teeth and Oropharynx as per pre-operative assessment

## 2023-02-27 NOTE — Transfer of Care (Signed)
Immediate Anesthesia Transfer of Care Note  Patient: Chris Williams  Procedure(s) Performed: CYSTOSCOPY RIGHT RETROGRADE PYELOGRAM RIGHT URETEROSCOPY/HOLMIUM LASER/STENT PLACEMENT (Right)  Patient Location: PACU  Anesthesia Type:General  Level of Consciousness: sedated  Airway & Oxygen Therapy: Patient Spontanous Breathing and Patient connected to face mask oxygen  Post-op Assessment: Report given to RN and Post -op Vital signs reviewed and stable  Post vital signs: Reviewed and stable  Last Vitals:  Vitals Value Taken Time  BP    Temp    Pulse 92 02/27/23 0903  Resp    SpO2 96 % 02/27/23 0903  Vitals shown include unfiled device data.  Last Pain:  Vitals:   02/27/23 0646  TempSrc: Oral  PainSc: 4       Patients Stated Pain Goal: 3 (02/27/23 0646)  Complications: No notable events documented.

## 2023-02-27 NOTE — Discharge Instructions (Addendum)
1 - You may have urinary urgency (bladder spasms) and bloody urine on / off with stent in place. This is normal.  2 - Remove tethered stent on Friday morning at home by pulling on string, then blue-white plastic tubing and discarding. Office is open Friday if any issues arise.   3 - Call MD or go to ER for fever >102, severe pain / nausea / vomiting not relieved by medications, or acute change in medical status

## 2023-02-27 NOTE — H&P (Signed)
Chris Williams is an 52 y.o. male.    Chief Complaint: Pre-Op RIGHT Ureteroscopic Stone Manipulaion  HPI:   1 - Recurrent Urolithiasis - ?Pre 2022 - URS x 1 ?12/2020 Left URS for Left distal 6mm + proximal 6mm ureteral stones.   ??Recent Surveillance:  ?02/2023 - CT - 5mm Rt mid ureteral sotne (just below iliacs, few punctate rneal as well).   ?2 - Medial Stone Disease / Severe Oliguria / Hyperuricemia - ?Eval 2022: BMP, PTH, URate - elevated Urate 9.0 ; Composition - 50% CaOx / 50% Uric Acid; 24 Hr Urines - severe low volume <0.5L / day ==> Allopruinol 300 + increased fluid ?   ??PMH sig for asthma, left total hip for trauma. NO ischemic CV disease / blood thinners. 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 NO PCP at presnt.   Today "[name]" is seen to proceed with RIGHT ureteroscopic stone manipulaiton. No interval fevers. Most recent UCX negative.    Past Medical History:  Diagnosis Date   Anxiety    Arthritis    hip   History of kidney stones    Hypertension    Nausea without vomiting 05/12/2019    Past Surgical History:  Procedure Laterality Date   CYSTOSCOPY WITH RETROGRADE PYELOGRAM, URETEROSCOPY AND STENT PLACEMENT Left 12/13/2020   Procedure: CYSTOSCOPY WITH RETROGRADE PYELOGRAM, URETEROSCOPY AND STENT PLACEMENT;  Surgeon: Sebastian Ache, MD;  Location: [facility];  Service: Urology;  Laterality: Left;  1 HR   CYSTOSCOPY/RETROGRADE/URETEROSCOPY/STONE EXTRACTION WITH BASKET Right 09/29/2012   Procedure: CYSTOSCOPY/RETROGRADE/URETEROSCOPY/STONE EXTRACTION WITH BASKET;  Surgeon: Anner Crete, MD;  Location: [facility];  Service: Urology;  Laterality: Right;   HOLMIUM LASER APPLICATION Right 09/29/2012   Procedure: HOLMIUM LASER APPLICATION;  Surgeon: Anner Crete, MD;  Location: [facility];  Service: Urology;  Laterality: Right;   HOLMIUM LASER APPLICATION Left 12/13/2020   Procedure: HOLMIUM LASER APPLICATION;  Surgeon: Sebastian Ache, MD;  Location: [facility];  Service: Urology;  Laterality: Left;   TOTAL HIP ARTHROPLASTY Left 03/31/2019   Procedure: TOTAL HIP ARTHROPLASTY ANTERIOR APPROACH;  Surgeon: Durene Romans, MD;  Location: [facility];  Service: Orthopedics;  Laterality: Left;  70 mins    Family History  Problem Relation Age of Onset   Hypertension Father    CAD Other    Deep vein thrombosis Other    Coronary artery disease Other    Hypertension Mother    Cancer Maternal Grandmother    Hyperlipidemia Maternal Grandmother    Stroke Maternal Grandmother    Cancer Maternal Grandfather    Hyperlipidemia Maternal Grandfather    Stroke Maternal Grandfather    Cancer Paternal Grandmother    Hyperlipidemia Paternal Grandmother    Cancer Paternal Grandfather    Hyperlipidemia Paternal Grandfather    Social History:  reports that he has never smoked. He has never used smokeless tobacco. He reports that he does not drink alcohol and does not use drugs.  Allergies: No Known Allergies  No medications prior to admission.    No results found for this or any previous visit (from the past 48 hour(s)). No results found.  Review of Systems  Constitutional:  Negative for chills and fever.  Genitourinary:  Positive for flank pain.  All other systems reviewed and are negative.   There were no vitals taken for this visit. Physical Exam Vitals reviewed.  HENT:     Head: Normocephalic.     Mouth/Throat:     Mouth: Mucous  membranes are moist.  Eyes:     Pupils: Pupils are equal, round, and reactive to light.  Cardiovascular:     Rate and Rhythm: Normal rate.  Pulmonary:     Effort: Pulmonary effort is normal.  Abdominal:     General: Abdomen is flat.  Genitourinary:    Comments: Mild Rt CVAT at present Musculoskeletal:        General: Normal range of motion.     Cervical back: Normal range of motion.  Neurological:     Mental Status: He is alert.      Assessment/Plan  Proceed as planned with RIGHT ureteroscopic stone  manipulation. Risks, benefits, alternatives, expectd peri-op course discussed previously and reiterated today.   Loletta Parish., MD 02/27/2023, 5:54 AM

## 2023-02-27 NOTE — Anesthesia Preprocedure Evaluation (Signed)
Anesthesia Evaluation  Patient identified by MRN, date of birth, ID band Patient awake    Reviewed: Allergy & Precautions, NPO status , Patient's Chart, lab work & pertinent test results, reviewed documented beta blocker date and time   History of Anesthesia Complications Negative for: history of anesthetic complications  Airway Mallampati: II  TM Distance: >3 FB Neck ROM: Full    Dental no notable dental hx.    Pulmonary asthma    Pulmonary exam normal breath sounds clear to auscultation       Cardiovascular hypertension, Normal cardiovascular exam Rhythm:Regular Rate:Normal     Neuro/Psych   Anxiety     negative neurological ROS     GI/Hepatic negative GI ROS, Neg liver ROS,,,  Endo/Other  negative endocrine ROS    Renal/GU CRFRenal diseaseNephrolithiasis   negative genitourinary   Musculoskeletal  (+) Arthritis ,    Abdominal   Peds negative pediatric ROS (+)  Hematology negative hematology ROS (+)   Anesthesia Other Findings   Reproductive/Obstetrics negative OB ROS                             Anesthesia Physical Anesthesia Plan  ASA: 2  Anesthesia Plan: General   Post-op Pain Management:    Induction: Intravenous  PONV Risk Score and Plan: Ondansetron, Dexamethasone and Treatment may vary due to age or medical condition  Airway Management Planned: LMA  Additional Equipment:   Intra-op Plan:   Post-operative Plan: Extubation in OR  Informed Consent: I have reviewed the patients History and Physical, chart, labs and discussed the procedure including the risks, benefits and alternatives for the proposed anesthesia with the patient or authorized representative who has indicated his/her understanding and acceptance.     Dental advisory given  Plan Discussed with: CRNA  Anesthesia Plan Comments:        Anesthesia Quick Evaluation

## 2023-02-27 NOTE — Brief Op Note (Signed)
02/27/2023  8:57 AM  PATIENT:  Dalia Heading  52 y.o. male  PRE-OPERATIVE DIAGNOSIS:  RIGHT URETERAL STONE  POST-OPERATIVE DIAGNOSIS:  RIGHT URETERAL STONE  PROCEDURE:  Procedure(s) with comments: CYSTOSCOPY RIGHT RETROGRADE PYELOGRAM RIGHT URETEROSCOPY/HOLMIUM LASER/STENT PLACEMENT (Right) - 60 MINS FOR CASE  SURGEON:  Surgeons and Role:    * Brianna Bennett, Delbert Phenix., MD - Primary  PHYSICIAN ASSISTANT:   ASSISTANTS: none   ANESTHESIA:   general  EBL:  minimal   BLOOD ADMINISTERED:none  DRAINS: none   LOCAL MEDICATIONS USED:  NONE  SPECIMEN:  No Specimen  DISPOSITION OF SPECIMEN:  N/A  COUNTS:  YES  TOURNIQUET:  * No tourniquets in log *  DICTATION: .Other Dictation: Dictation Number [id number]  PLAN OF CARE: Discharge to home after PACU  PATIENT DISPOSITION:  PACU - hemodynamically stable.   Delay start of Pharmacological VTE agent (>24hrs) due to surgical blood loss or risk of bleeding: yes

## 2023-02-27 NOTE — Op Note (Unsigned)
NAMECEQUAN, PEDERSON MEDICAL RECORD NO: [id number] ACCOUNT NO: [id number] DATE OF BIRTH: 1971-02-19 FACILITY: [facility] LOCATION: [facility] PHYSICIAN: Sebastian Ache, MD  Operative Report   DATE OF PROCEDURE: 02/27/2023  PREOPERATIVE DIAGNOSES:  Right ureteral stone, history of recurrent urolithiasis.  POSTOPERATIVE DIAGNOSES:  Interval passage of right ureteral stone, small volume right renal stones.  PROCEDURE PERFORMED:  1.  Cystoscopy, bilateral retrograde pyelograms interpretation. 2.  Right ureteroscopy with laser lithotripsy. 3.  Insertion of right ureteral stent.  ESTIMATED BLOOD LOSS:  Nil.  COMPLICATIONS:  None.  SPECIMEN:  None.  FINDINGS:  1.  Interval passage of right mid ureteral stone. 2.  Unremarkable retrograde pyelograms. 3.  Small volume right-sided papillary tip calcifications.  These were amenable to laser ablation. 4.  Complete resolution of all accessible stone fragments larger than one-third mm following laser lithotripsy on the right side. 5.  Successful placement of right ureteral stent, proximal end in the renal pelvis, distal end in urinary bladder, with tether.  INDICATIONS:  The patient is a very pleasant 53 year old man with history of recurrent urolithiasis, uric acid in type.  He has been compliant with allopurinol, which has lowered his recurrence frequency some. He was found on workup of colicky flank pain  to have a recurrence of a right distal ureteral stone, approximately 4 mm by recent ER imaging as well as some questionable punctate renal calcifications on the right side.  Options discussed for management including medical therapy versus ureteroscopy  versus shockwave lithotripsy.  The patient wished to proceed with ureteroscopy.  We discussed this morning, he reported some mild residual colic and no obvious interval stone passage.  Informed consent was obtained and placed in medical record.  PROCEDURE IN DETAIL:  The patient being verified,  procedure being right ureteroscopic stone manipulation was confirmed.  Procedure timeout was performed.  Intravenous antibiotics administered.  General anesthesia was induced.  The patient was placed into  a low lithotomy position.  Sterile field was created, prepped and draped the patient's penis, perineum, and proximal thighs using iodine.  Cystourethroscopy was performed using 21-French rigid cystoscope with offset lens.  Inspection of anterior and  standard posterior urethra was unremarkable.  Inspection of urinary bladder revealed no diverticula, calcifications, papillary lesions.  There was some edema and erythema at the area of the right ureteral orifice, likely consistent with recent stone.  He  did mention some mild left sided pain in the holding area.  Therefore, left retrograde pyelogram was initially obtained.  Left retrograde pyelogram demonstrated single left ureter, single system left kidney.  No filling defects or narrowing noted.  Next, right retrograde pyelogram was obtained.  Right retrograde pyelogram demonstrated single right ureter, single system right kidney.  I did not appreciate any obvious filling defects within the right ureter.  Again, it was felt to possibly represent interval passage of stone versus stone just in a  nonobstructing orientation. As the goal today was to verify right side stone free, a 0.038 ZIPwire was advanced to the level of the upper pole, set aside as a safety wire.  An 8-French feeding tube placed in the urinary bladder for pressure release and  semirigid ureteroscopy performed of the distal two-thirds of right ureter alongside a separate sensor working wire.  No mucosal abnormalities were found and the stone in question was not seen, especially in the area of the iliacs where it was previously  noted and the only signs of recent stone were just some mucosal edema at the orifice  proper on the right side.  The semirigid scope was then exchanged via sensor  working wire for a medium length ureteral access sheath to the level of mid ureter using  continuous fluoroscopic guidance and flexible digital ureteroscopy was performed of the proximal right ureter and systematic inspection of the right kidney including all calices x3.  There was no evidence of free floating intraluminal stone whatsoever on  the right side.  There were multifocal papillary tip calcifications, mostly in the mid and lower poles foci were quite small, approximately a mm. As the goal today was to render stone free, holmium laser energy was applied to these sites using  settings of 1 joule and 10 Hz in ablating type energy and these foci of papillary tip calcifications were completely ablated, generating fragments that were certainly less than one-third of mm.  Access sheath was removed under continuous vision, no  significant mucosal abnormalities were found.  Given access sheath usage, it was felt that brief interval stenting with tethered stent would be most prudent.  As such, a new 5 x 26 Polaris type stent was carefully placed using fluoroscopic guidance.   Good proximal and distal planes were noted.  Tether was left in place, fashioned to the dorsum of penis.  The procedure was terminated.  The patient tolerated procedure well, no immediate periprocedural complications.  The patient taken to postanesthesia  care in stable condition.   SHW D: 02/27/2023 9:03:02 am T: 02/27/2023 9:47:00 am  JOB: [id number]/ [id number]

## 2023-02-27 NOTE — Anesthesia Postprocedure Evaluation (Signed)
Anesthesia Post Note  Patient: Chris Williams  Procedure(s) Performed: CYSTOSCOPY RIGHT RETROGRADE PYELOGRAM RIGHT URETEROSCOPY/HOLMIUM LASER/STENT PLACEMENT (Right)     Patient location during evaluation: PACU Anesthesia Type: General Level of consciousness: awake and alert Pain management: pain level controlled Vital Signs Assessment: post-procedure vital signs reviewed and stable Respiratory status: spontaneous breathing, nonlabored ventilation, respiratory function stable and patient connected to nasal cannula oxygen Cardiovascular status: blood pressure returned to baseline and stable Postop Assessment: no apparent nausea or vomiting Anesthetic complications: no   No notable events documented.  Last Vitals:  Vitals:   02/27/23 0930 02/27/23 0945  BP: 119/88 (!) 117/90  Pulse: 72 73  Resp: 15 11  Temp:  37.1 C  SpO2: 94% 96%    Last Pain:  Vitals:   02/27/23 0945  TempSrc:   PainSc: 0-No pain                 [place] [facility]

## 2023-02-28 ENCOUNTER — Encounter (HOSPITAL_COMMUNITY): Payer: Self-pay | Admitting: Urology
